# Patient Record
Sex: Male | Born: 1935 | Race: White | Hispanic: No
Health system: Midwestern US, Academic
[De-identification: ages and names within clinical notes are randomized; demographics above are authoritative.]

---

## 2017-05-31 ENCOUNTER — Encounter: Admit: 2017-05-31 | Discharge: 2017-05-31 | Payer: MEDICARE

## 2017-05-31 ENCOUNTER — Ambulatory Visit: Admit: 2017-05-31 | Discharge: 2017-06-01 | Payer: MEDICARE

## 2017-05-31 DIAGNOSIS — F329 Major depressive disorder, single episode, unspecified: ICD-10-CM

## 2017-05-31 DIAGNOSIS — I2510 Atherosclerotic heart disease of native coronary artery without angina pectoris: Principal | ICD-10-CM

## 2017-05-31 DIAGNOSIS — E785 Hyperlipidemia, unspecified: Secondary | ICD-10-CM

## 2017-05-31 DIAGNOSIS — D649 Anemia, unspecified: ICD-10-CM

## 2017-05-31 DIAGNOSIS — E119 Type 2 diabetes mellitus without complications: ICD-10-CM

## 2017-05-31 DIAGNOSIS — J61 Pneumoconiosis due to asbestos and other mineral fibers: Secondary | ICD-10-CM

## 2017-05-31 DIAGNOSIS — Z9229 Personal history of other drug therapy: ICD-10-CM

## 2017-05-31 DIAGNOSIS — K219 Gastro-esophageal reflux disease without esophagitis: ICD-10-CM

## 2017-05-31 DIAGNOSIS — G43909 Migraine, unspecified, not intractable, without status migrainosus: ICD-10-CM

## 2017-05-31 DIAGNOSIS — E1169 Type 2 diabetes mellitus with other specified complication: ICD-10-CM

## 2017-05-31 DIAGNOSIS — I10 Essential (primary) hypertension: ICD-10-CM

## 2017-05-31 DIAGNOSIS — I4891 Unspecified atrial fibrillation: ICD-10-CM

## 2017-05-31 DIAGNOSIS — N529 Male erectile dysfunction, unspecified: ICD-10-CM

## 2017-05-31 DIAGNOSIS — T50905A Adverse effect of unspecified drugs, medicaments and biological substances, initial encounter: ICD-10-CM

## 2017-05-31 MED ORDER — ATORVASTATIN 20 MG PO TAB
20 mg | ORAL_TABLET | Freq: Every day | ORAL | 3 refills | Status: AC
Start: 2017-05-31 — End: 2017-06-16

## 2017-05-31 NOTE — Progress Notes
Date of Service: 05/31/2017    Andrew Chung is a 83 y.o. male.       HPI     Andrew Chung is a very pleasant 83 year old gentleman who looks wonderful for his age.  He is coming in for a regularly scheduled visit regarding his coronary disease.    He has lost some weight.  The back pain is much better after the surgery.  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  His two-vessel bypass was in 2012 using a mammary to the LAD and a vein graft to the RCA.  A stress test with [place] was done back in January this year and showed no evidence of significant perfusion abnormality but there was a note indicating presence of TID.  I think this is an artifact as it does not go along with the patient's lack of symptoms and his excellent excellent exertional capacity.    He has not been taking his lovastatin due to muscle aches and pains.         Vitals:    05/31/17 1408   BP: 150/62   Pulse: 56   Weight: 75.3 kg (166 lb)   Height: 1.753 m (5' 9)     Body mass index is 24.51 kg/m???.     Past Medical History  Patient Active Problem List    Diagnosis Date Noted   ??? Vertigo 09/11/2013     08/24/13 CT Head w/o Contrast: [facility]:  1. Mild to moderate chronic periventricular ischemic changes.  2. No acute appearing intracranial process.       ??? PVC's (premature ventricular contractions) 05/14/2013   ??? Sinus bradycardia 04/09/2013     8/14 treadmill testing Underlying rhythm is sinus brady with isolated PVCs. The pt exercised on a Std Bruce protocol. The HR increased to the 60s & 70s quickly. It was 100-110/min by the end of Stg 1. The peak HR was 137/min (90% of MPHR) at the end of 6 min, No ST depressions were seen, The PVCs were suppressed during exercise.  8/14 HR 45/min, no symptoms. TSH normal in 8/14.     ??? Pre-syncope 11/03/2011     2/13 seen at the [facility] hosp ER, ACEI & bb d/ced as the HR was in the 40s & BP was also low ??? CVA (cerebral infarction) 07/04/2011     10/12 MRI done for headaches- showed a small CVA per pt.     ??? PAF (paroxysmal atrial fibrillation) (HCC) 11/18/2010     7/12 off amio  Developed atrial fibrillation post-CABG phase. Rx with amiodarone.     ??? 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 GERD (gastroesophageal reflux disease) 08/06/2009   ??? Urethral stricture 08/06/2009   ??? ED (erectile dysfunction) 08/06/2009   ??? HTN (hypertension) 03/11/2009   ??? CAD (coronary artery disease) 02/17/2009     5/13 Rega thallium -ve for ischemia. Done for evaluation of CPs after CABG  1.  01/07 BMS distal LAD3x12, BMS distal LCF 2-25x12, prox LAD 50% Rx medically  2.  08/09 stress thall EF 66% normal  3.  06/10 Echo EF 60%, mild LVH, chambers normal, mild aortic sclerosis, PAP 27  4.  07/10 chest pain.  Cath at [place]- LM normal , LAD prox 50% mid 60%, distal  stent 20%, LCX stants 20%,    RCA chronic 99% stenosis.  FFR negative in LAD EF 55% Rx medically  5.  3/12 CABG 2 V bypass     ??? Diabetes mellitus type 2 in obese (HCC) 02/17/2009   ??? Dyslipidemia 02/17/2009     5/13 LDL 101  5/12 excellent profile except for TG at 202     ??? Pulmonary asbestosis (HCC) 02/17/2009     F/u with pulmonary at [facility]           Review of Systems   Constitution: Positive for weight loss.   Musculoskeletal: Positive for stiffness.   Gastrointestinal: Positive for abdominal pain.   All other systems reviewed and are negative.      Physical Exam  General Appearance: well for age, appears comfortable  Skin: warm, no ulcers, no xanthomas  Eyes/lids: no conjunctival pallor, no arcus, no xanthelasma  Lips/oral mucosa: no cyanosis  Neck: neck veins flat  Carotids: normal upstroke, no bruit  Chest: normal appearance  Lungs: clear  Cardiac rhythm: regular rhythm & normal rate  Cardiac auscultation: Normal S1 & S2, no S3 or S4, no murmur Abdomen: soft, non tender, bowel sounds normal, no pulsations/bruits  Extremities: no LE edema, no varicosities, 2+ distal pulses  Neurologic/psych: oriented, no gross motor deficits, normal gait, normal mood & affect     The EKG shows mild sinus bradycardia, otherwise within normal limits  Cardiovascular Studies      Problems Addressed Today  Encounter Diagnoses   Name Primary?   ??? Dyslipidemia Yes   ??? Coronary artery disease involving native coronary artery of native heart without angina pectoris        Assessment and Plan     ???In summary, Andrew Chung is an 83 year old gentleman, looks much younger than his stated age, here with the underlying cardiovascular issues:   ??????  1. Hypertension,???slightly uncontrolled.  We will follow this closely before making any medication changes.  It has been fairly well controlled in the past.  2. Coronary artery disease, history of CABG.???He  has had no angina lately.  3. Dyslipidemia, not on statins due to muscle aches and pains.  Atorvastatin at 20 mg daily is being started.  A cholesterol profile will be needed in 3 months.  4. Sinus bradycardia, asymptomatic.  ???  It was a pleasure to see Andrew Chung in the clinic today.  ???             Current Medications (including today's revisions)  ??? amLODIPine (NORVASC) 5 mg tablet Take 1 tablet by mouth daily.   ??? aspirin 325 mg tablet Take 1 Tab by mouth daily.   ??? atorvastatin (LIPITOR) 20 mg tablet Take one tablet by mouth daily.   ??? clopiDOGrel (PLAVIX) 75 mg tablet Take 75 mg by mouth daily.     ??? nitroglycerin (NITROSTAT) 0.4 mg tablet For CHEST PAIN, sit down, place 1 tab under tongue, may repeat 2 more times if chest pain persists. If unrelieved, call 911, go to ER   ??? omeprazole DR(+) (PRILOSEC) 20 mg capsule Take 20 mg by mouth daily.   ??? sitaGLIPtin (JANUVIA) 100 mg tab tablet Take 100 mg by mouth daily.   ??? tamsulosin (FLOMAX) 0.4 mg capsule Take 0.4 mg by mouth at bedtime daily. Do not crush, chew or open capsules. Take 30 minutes following the same meal each day.

## 2017-06-01 ENCOUNTER — Encounter: Admit: 2017-06-01 | Discharge: 2017-06-01 | Payer: MEDICARE

## 2017-06-01 DIAGNOSIS — Z951 Presence of aortocoronary bypass graft: ICD-10-CM

## 2017-06-01 DIAGNOSIS — R001 Bradycardia, unspecified: ICD-10-CM

## 2017-06-01 DIAGNOSIS — I10 Essential (primary) hypertension: ICD-10-CM

## 2017-06-01 DIAGNOSIS — I2510 Atherosclerotic heart disease of native coronary artery without angina pectoris: Principal | ICD-10-CM

## 2017-06-01 DIAGNOSIS — E785 Hyperlipidemia, unspecified: Secondary | ICD-10-CM

## 2017-06-01 NOTE — Telephone Encounter
Pt's girlfriend Bonita Quin called to see what was said during office visit since pt gets confused about it. She usually comes with him but couldn't yesterday due to work. She is on his list of people to talk to. I called her back and read to her the AVS. She verbalized understanding.

## 2017-06-16 ENCOUNTER — Encounter: Admit: 2017-06-16 | Discharge: 2017-06-16 | Payer: MEDICARE

## 2017-06-16 NOTE — Telephone Encounter
Pt's friend Bonita Quin called stating pt has stopped taking atorvastatin due to "severe itching". She reports pt's symptoms (itching) increased for 3 nights. He was not SOA and did not develop hives. Bonita Quin gave pt a Benadryl on the 3rd night and symptoms resolved. Pt stopped taking atorvastatin. I will talk with Dr Iver Nestle on 10/15 to let him know of pt's intolerance to atorvastatin.

## 2017-06-19 NOTE — Telephone Encounter
Reviewed information with Dr Iver Nestle who recommends pt try atorvastatin 20 mg every other day for a week or 2. If no symptoms, increase to daily. If pt still does not tolerate atorvastatin, Dr Iver Nestle recommends pt stop atorvastatin and try pravastatin 40 mg every other day for a week or 2. If pt tolerates pravastatin, increase to 40 mg daily. Pt will return call if he is not tolerating atorvastatin. Dr Iver Nestle is wanting pt to be on a statin due to history of CABG. Pt agreeable to plan.

## 2017-08-25 ENCOUNTER — Encounter: Admit: 2017-08-25 | Discharge: 2017-08-25 | Payer: MEDICARE

## 2017-08-25 NOTE — Progress Notes
Lab order with letter mailed.

## 2017-09-07 ENCOUNTER — Encounter: Admit: 2017-09-07 | Discharge: 2017-09-07 | Payer: MEDICARE

## 2017-09-07 DIAGNOSIS — I2510 Atherosclerotic heart disease of native coronary artery without angina pectoris: Principal | ICD-10-CM

## 2017-09-07 LAB — LIPID PROFILE
Lab: 147 g/dL (ref >60)
Lab: 55 FL — ABNORMAL LOW (ref 7–11)
Lab: 71 K/UL (ref 150–400)
Lab: 82 % (ref 11–15)

## 2017-10-12 ENCOUNTER — Encounter: Admit: 2017-10-12 | Discharge: 2017-10-12 | Payer: MEDICARE

## 2017-10-12 MED ORDER — AMLODIPINE 5 MG PO TAB
5 mg | ORAL_TABLET | Freq: Every day | ORAL | 2 refills | Status: AC
Start: 2017-10-12 — End: 2017-11-09

## 2017-10-16 ENCOUNTER — Encounter: Admit: 2017-10-16 | Discharge: 2017-10-16 | Payer: MEDICARE

## 2017-10-16 LAB — COMPREHENSIVE METABOLIC PANEL
Lab: 0.8
Lab: 0.9
Lab: 10
Lab: 10 — ABNORMAL HIGH (ref 27–31)
Lab: 105
Lab: 139 — ABNORMAL LOW (ref 4.7–6.1)
Lab: 160 — ABNORMAL HIGH (ref 83–110)
Lab: 20
Lab: 24
Lab: 4
Lab: 4.2
Lab: 7.6
Lab: 84
Lab: 87
Lab: 9.6

## 2017-10-16 LAB — CBC: Lab: 4.8

## 2017-10-20 ENCOUNTER — Encounter: Admit: 2017-10-20 | Discharge: 2017-10-20 | Payer: MEDICARE

## 2017-11-03 ENCOUNTER — Encounter: Admit: 2017-11-03 | Discharge: 2017-11-03 | Payer: MEDICARE

## 2017-11-03 ENCOUNTER — Ambulatory Visit: Admit: 2017-11-03 | Discharge: 2017-11-04 | Payer: MEDICARE

## 2017-11-03 DIAGNOSIS — I10 Essential (primary) hypertension: ICD-10-CM

## 2017-11-03 DIAGNOSIS — D649 Anemia, unspecified: ICD-10-CM

## 2017-11-03 DIAGNOSIS — N529 Male erectile dysfunction, unspecified: ICD-10-CM

## 2017-11-03 DIAGNOSIS — G43909 Migraine, unspecified, not intractable, without status migrainosus: ICD-10-CM

## 2017-11-03 DIAGNOSIS — E1169 Type 2 diabetes mellitus with other specified complication: ICD-10-CM

## 2017-11-03 DIAGNOSIS — E119 Type 2 diabetes mellitus without complications: ICD-10-CM

## 2017-11-03 DIAGNOSIS — K219 Gastro-esophageal reflux disease without esophagitis: ICD-10-CM

## 2017-11-03 DIAGNOSIS — E785 Hyperlipidemia, unspecified: ICD-10-CM

## 2017-11-03 DIAGNOSIS — J61 Pneumoconiosis due to asbestos and other mineral fibers: Secondary | ICD-10-CM

## 2017-11-03 DIAGNOSIS — I2510 Atherosclerotic heart disease of native coronary artery without angina pectoris: Principal | ICD-10-CM

## 2017-11-03 DIAGNOSIS — Z9229 Personal history of other drug therapy: ICD-10-CM

## 2017-11-03 DIAGNOSIS — T50905A Adverse effect of unspecified drugs, medicaments and biological substances, initial encounter: ICD-10-CM

## 2017-11-03 DIAGNOSIS — F329 Major depressive disorder, single episode, unspecified: ICD-10-CM

## 2017-11-03 DIAGNOSIS — I4891 Unspecified atrial fibrillation: ICD-10-CM

## 2017-11-03 MED ORDER — ATORVASTATIN 20 MG PO TAB
20 mg | ORAL_TABLET | Freq: Every day | ORAL | 3 refills | Status: AC
Start: 2017-11-03 — End: ?

## 2017-11-03 MED ORDER — OLMESARTAN 5 MG PO TAB
5 mg | ORAL_TABLET | Freq: Every day | ORAL | 3 refills | 90.00000 days | Status: AC
Start: 2017-11-03 — End: 2017-11-10

## 2017-11-04 DIAGNOSIS — Z79899 Other long term (current) drug therapy: ICD-10-CM

## 2017-11-04 DIAGNOSIS — E785 Hyperlipidemia, unspecified: ICD-10-CM

## 2017-11-04 DIAGNOSIS — R001 Bradycardia, unspecified: ICD-10-CM

## 2017-11-04 DIAGNOSIS — I2510 Atherosclerotic heart disease of native coronary artery without angina pectoris: Principal | ICD-10-CM

## 2017-11-08 ENCOUNTER — Encounter: Admit: 2017-11-08 | Discharge: 2017-11-08 | Payer: MEDICARE

## 2017-11-09 ENCOUNTER — Encounter: Admit: 2017-11-09 | Discharge: 2017-11-09 | Payer: MEDICARE

## 2017-11-10 MED ORDER — AMLODIPINE 5 MG PO TAB
5 mg | ORAL_TABLET | Freq: Every day | ORAL | 1 refills | Status: AC
Start: 2017-11-10 — End: 2018-12-14

## 2017-11-20 ENCOUNTER — Encounter: Admit: 2017-11-20 | Discharge: 2017-11-20 | Payer: MEDICARE

## 2017-11-21 ENCOUNTER — Ambulatory Visit: Admit: 2017-11-21 | Discharge: 2017-11-21 | Payer: MEDICARE

## 2017-11-21 DIAGNOSIS — I2510 Atherosclerotic heart disease of native coronary artery without angina pectoris: Principal | ICD-10-CM

## 2017-11-21 MED ORDER — ALBUTEROL SULFATE 90 MCG/ACTUATION IN HFAA
2 | RESPIRATORY_TRACT | 0 refills | Status: DC | PRN
Start: 2017-11-21 — End: 2017-11-26

## 2017-11-21 MED ORDER — AMINOPHYLLINE 500 MG/20 ML IV SOLN
50 mg | INTRAVENOUS | 0 refills | Status: AC | PRN
Start: 2017-11-21 — End: ?

## 2017-11-21 MED ORDER — REGADENOSON 0.4 MG/5 ML IV SYRG
.4 mg | Freq: Once | INTRAVENOUS | 0 refills | Status: CP
Start: 2017-11-21 — End: ?
  Administered 2017-11-21: 14:00:00 0.4 mg via INTRAVENOUS

## 2017-11-21 MED ORDER — NITROGLYCERIN 0.4 MG SL SUBL
.4 mg | SUBLINGUAL | 0 refills | Status: AC | PRN
Start: 2017-11-21 — End: ?

## 2017-11-21 MED ORDER — SODIUM CHLORIDE 0.9 % IV SOLP
250 mL | INTRAVENOUS | 0 refills | Status: AC | PRN
Start: 2017-11-21 — End: ?

## 2017-12-05 ENCOUNTER — Ambulatory Visit: Admit: 2017-12-05 | Discharge: 2017-12-06 | Payer: MEDICARE

## 2017-12-05 ENCOUNTER — Encounter: Admit: 2017-12-05 | Discharge: 2017-12-05 | Payer: MEDICARE

## 2017-12-05 DIAGNOSIS — J61 Pneumoconiosis due to asbestos and other mineral fibers: ICD-10-CM

## 2017-12-05 DIAGNOSIS — K219 Gastro-esophageal reflux disease without esophagitis: ICD-10-CM

## 2017-12-05 DIAGNOSIS — I2510 Atherosclerotic heart disease of native coronary artery without angina pectoris: Principal | ICD-10-CM

## 2017-12-05 DIAGNOSIS — N529 Male erectile dysfunction, unspecified: ICD-10-CM

## 2017-12-05 DIAGNOSIS — G43909 Migraine, unspecified, not intractable, without status migrainosus: ICD-10-CM

## 2017-12-05 DIAGNOSIS — F329 Major depressive disorder, single episode, unspecified: ICD-10-CM

## 2017-12-05 DIAGNOSIS — Z9229 Personal history of other drug therapy: ICD-10-CM

## 2017-12-05 DIAGNOSIS — D649 Anemia, unspecified: ICD-10-CM

## 2017-12-05 DIAGNOSIS — T50905A Adverse effect of unspecified drugs, medicaments and biological substances, initial encounter: ICD-10-CM

## 2017-12-05 DIAGNOSIS — I4891 Unspecified atrial fibrillation: ICD-10-CM

## 2017-12-05 DIAGNOSIS — E119 Type 2 diabetes mellitus without complications: ICD-10-CM

## 2017-12-05 DIAGNOSIS — E785 Hyperlipidemia, unspecified: Secondary | ICD-10-CM

## 2017-12-05 DIAGNOSIS — I10 Essential (primary) hypertension: ICD-10-CM

## 2017-12-05 DIAGNOSIS — E1169 Type 2 diabetes mellitus with other specified complication: ICD-10-CM

## 2017-12-06 DIAGNOSIS — I2510 Atherosclerotic heart disease of native coronary artery without angina pectoris: Principal | ICD-10-CM

## 2017-12-06 DIAGNOSIS — E785 Hyperlipidemia, unspecified: ICD-10-CM

## 2017-12-06 DIAGNOSIS — I10 Essential (primary) hypertension: ICD-10-CM

## 2017-12-06 DIAGNOSIS — Z951 Presence of aortocoronary bypass graft: ICD-10-CM

## 2018-03-05 ENCOUNTER — Encounter: Admit: 2018-03-05 | Discharge: 2018-03-05 | Payer: MEDICARE

## 2018-09-11 ENCOUNTER — Encounter: Admit: 2018-09-11 | Discharge: 2018-09-11 | Payer: MEDICARE

## 2018-12-13 ENCOUNTER — Encounter: Admit: 2018-12-13 | Discharge: 2018-12-13 | Payer: MEDICARE

## 2018-12-14 MED ORDER — AMLODIPINE 5 MG PO TAB
ORAL_TABLET | Freq: Every day | 0 refills | Status: DC
Start: 2018-12-14 — End: 2019-04-03

## 2018-12-31 ENCOUNTER — Encounter: Admit: 2018-12-31 | Discharge: 2018-12-31 | Payer: MEDICARE

## 2019-01-01 ENCOUNTER — Encounter: Admit: 2019-01-01 | Discharge: 2019-01-01 | Payer: MEDICARE

## 2019-01-01 ENCOUNTER — Ambulatory Visit: Admit: 2019-01-01 | Discharge: 2019-01-02 | Payer: MEDICARE

## 2019-01-01 DIAGNOSIS — E1169 Type 2 diabetes mellitus with other specified complication: ICD-10-CM

## 2019-01-01 DIAGNOSIS — I2510 Atherosclerotic heart disease of native coronary artery without angina pectoris: Principal | ICD-10-CM

## 2019-01-01 DIAGNOSIS — E119 Type 2 diabetes mellitus without complications: ICD-10-CM

## 2019-01-01 DIAGNOSIS — F329 Major depressive disorder, single episode, unspecified: ICD-10-CM

## 2019-01-01 DIAGNOSIS — I10 Essential (primary) hypertension: ICD-10-CM

## 2019-01-01 DIAGNOSIS — N529 Male erectile dysfunction, unspecified: ICD-10-CM

## 2019-01-01 DIAGNOSIS — K219 Gastro-esophageal reflux disease without esophagitis: ICD-10-CM

## 2019-01-01 DIAGNOSIS — G43909 Migraine, unspecified, not intractable, without status migrainosus: ICD-10-CM

## 2019-01-01 DIAGNOSIS — D649 Anemia, unspecified: ICD-10-CM

## 2019-01-01 DIAGNOSIS — I4891 Unspecified atrial fibrillation: ICD-10-CM

## 2019-01-01 DIAGNOSIS — J61 Pneumoconiosis due to asbestos and other mineral fibers: Secondary | ICD-10-CM

## 2019-01-01 DIAGNOSIS — T50905A Adverse effect of unspecified drugs, medicaments and biological substances, initial encounter: ICD-10-CM

## 2019-01-01 DIAGNOSIS — Z9229 Personal history of other drug therapy: ICD-10-CM

## 2019-01-01 DIAGNOSIS — E785 Hyperlipidemia, unspecified: ICD-10-CM

## 2019-01-02 DIAGNOSIS — E785 Hyperlipidemia, unspecified: ICD-10-CM

## 2019-01-02 DIAGNOSIS — I2510 Atherosclerotic heart disease of native coronary artery without angina pectoris: Principal | ICD-10-CM

## 2019-01-08 ENCOUNTER — Encounter: Admit: 2019-01-08 | Discharge: 2019-01-08 | Payer: MEDICARE

## 2019-01-08 LAB — LIPID PROFILE
Lab: 115
Lab: 142
Lab: 2
Lab: 23
Lab: 59
Lab: 60

## 2019-01-17 ENCOUNTER — Encounter: Admit: 2019-01-17 | Discharge: 2019-01-17 | Payer: MEDICARE

## 2019-01-17 NOTE — Telephone Encounter
Pt called for FLP results. Given and explained no changes at this time.

## 2019-04-01 ENCOUNTER — Ambulatory Visit: Admit: 2019-04-01 | Discharge: 2019-04-02

## 2019-04-01 ENCOUNTER — Encounter: Admit: 2019-04-01 | Discharge: 2019-04-01

## 2019-04-01 DIAGNOSIS — I499 Cardiac arrhythmia, unspecified: Secondary | ICD-10-CM

## 2019-04-01 DIAGNOSIS — R42 Dizziness and giddiness: Secondary | ICD-10-CM

## 2019-04-01 DIAGNOSIS — Z9889 Other specified postprocedural states: Secondary | ICD-10-CM

## 2019-04-01 MED ORDER — PERFLUTREN LIPID MICROSPHERES 1.1 MG/ML IV SUSP
1-20 mL | Freq: Once | INTRAVENOUS | 0 refills | Status: CP | PRN
Start: 2019-04-01 — End: ?

## 2019-04-02 ENCOUNTER — Encounter: Admit: 2019-04-02 | Discharge: 2019-04-02

## 2019-04-02 NOTE — Progress Notes
Re Continuity of Care:Urgent Request for records for upcoming cardiology appointment on 04/09/19    Please fax the following records to [phone]:    Recent Office Visit Summary     Cardiology Imaging Reports:  Echocardiogram, Nuclear, MRI, CT    Recent EKG    Recent Labs    Thank you.

## 2019-04-02 NOTE — Telephone Encounter
Received call from SO relating patient saw PCP in last week and had echo yesterday [facility]) --advised by PCP to F/U with cardiology.  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  She reports patient tiring more easily and reporting "BP running lower than normal at times, 100-110/60's."  Scheduled for follow up with RKB on 8/4 in [facility] office as requested. Records requested from PCP

## 2019-04-03 ENCOUNTER — Encounter: Admit: 2019-04-03 | Discharge: 2019-04-03

## 2019-04-03 MED ORDER — AMLODIPINE 5 MG PO TAB
ORAL_TABLET | Freq: Every day | 0 refills | Status: DC
Start: 2019-04-03 — End: 2019-04-09

## 2019-04-04 ENCOUNTER — Encounter: Admit: 2019-04-04 | Discharge: 2019-04-04

## 2019-04-04 NOTE — Progress Notes
Andrew Chung, 1930/10/23 has an appointment with Dr. Curly Shores on 04/09/2019.    Please send recent lab results for continuity of care.    Thank you,   [name]    Phone: [phone]  Fax: [phone]

## 2019-04-09 ENCOUNTER — Encounter: Admit: 2019-04-09 | Discharge: 2019-04-09

## 2019-04-09 ENCOUNTER — Ambulatory Visit: Admit: 2019-04-09 | Discharge: 2019-04-09

## 2019-04-09 DIAGNOSIS — I4891 Unspecified atrial fibrillation: Secondary | ICD-10-CM

## 2019-04-09 DIAGNOSIS — T50905A Adverse effect of unspecified drugs, medicaments and biological substances, initial encounter: Secondary | ICD-10-CM

## 2019-04-09 DIAGNOSIS — I2510 Atherosclerotic heart disease of native coronary artery without angina pectoris: Secondary | ICD-10-CM

## 2019-04-09 DIAGNOSIS — R42 Dizziness and giddiness: Secondary | ICD-10-CM

## 2019-04-09 DIAGNOSIS — E1169 Type 2 diabetes mellitus with other specified complication: Secondary | ICD-10-CM

## 2019-04-09 DIAGNOSIS — I10 Essential (primary) hypertension: Secondary | ICD-10-CM

## 2019-04-09 DIAGNOSIS — G43909 Migraine, unspecified, not intractable, without status migrainosus: Secondary | ICD-10-CM

## 2019-04-09 DIAGNOSIS — N529 Male erectile dysfunction, unspecified: Secondary | ICD-10-CM

## 2019-04-09 DIAGNOSIS — K219 Gastro-esophageal reflux disease without esophagitis: Secondary | ICD-10-CM

## 2019-04-09 DIAGNOSIS — J61 Pneumoconiosis due to asbestos and other mineral fibers: Secondary | ICD-10-CM

## 2019-04-09 DIAGNOSIS — D649 Anemia, unspecified: Secondary | ICD-10-CM

## 2019-04-09 DIAGNOSIS — E119 Type 2 diabetes mellitus without complications: Secondary | ICD-10-CM

## 2019-04-09 DIAGNOSIS — Z9229 Personal history of other drug therapy: Secondary | ICD-10-CM

## 2019-04-09 DIAGNOSIS — E785 Hyperlipidemia, unspecified: Secondary | ICD-10-CM

## 2019-04-09 DIAGNOSIS — F329 Major depressive disorder, single episode, unspecified: Secondary | ICD-10-CM

## 2019-04-09 NOTE — Progress Notes
Holter Placement Record  Ordering Physician: Bhagat  Diagnosis: Dizziness  Brand: Biotel  Length: 48 hours  Holter Number: [id number]  Card Number: 0  Holter start: 04/09/2019 @ 1115 hours  Location where Holter was placed: [place]  Will Holter be returned by mail? Yes

## 2019-04-09 NOTE — Patient Instructions
STOP taking your amlodipine    Please wear a heart monitor for 48 hours    Dr. Curly Shores would like to see you in 3 months

## 2019-04-09 NOTE — Progress Notes
Date of Service: 04/09/2019    Andrew Abbe Sr. is a 85 y.o. male.       HPI   Andrew Chung is a very pleasant 85 year old gentleman with history of coronary disease, bypass surgery in 2012 with a mammary to the LAD and a vein graft to the RCA.    He denies any chest pains.  He has lost quite a bit of weight as he is not eating well.  The weight is down 253 pounds today.      The LV systolic function has been normal.  Stress test done in 2019 was negative for ischemia.    He tells me that he was seen at his primary clinic office and mentioned dizziness.  He was then sent on to [facility] where he was seen and evaluated in the emergency room and discharged.  He does not feel any palpitations.  There does not seem to be any orthostatic component to the dizziness.  He is not on beta-blockers due to prior history of sinus bradycardia.  He is on amlodipine at 5 mg daily.           Vitals:    04/09/19 1041   BP: 112/70   BP Source: Arm, Left Upper   Pulse: 63   Weight: 69.8 kg (153 lb 14.4 oz)   Height: 1.727 m (5' 8)   PainSc: Zero     Body mass index is 23.4 kg/m???.     Past Medical History  Patient Active Problem List    Diagnosis Date Noted   ??? Vertigo 09/11/2013     08/24/13 CT Head w/o Contrast: [facility]:  1. Mild to moderate chronic periventricular ischemic changes.  2. No acute appearing intracranial process.       ??? PVC's (premature ventricular contractions) 05/14/2013   ??? Sinus bradycardia 04/09/2013     8/14 treadmill testing Underlying rhythm is sinus brady with isolated PVCs. The pt exercised on a Std Bruce protocol. The HR increased to the 60s & 70s quickly. It was 100-110/min by the end of Stg 1. The peak HR was 137/min (90% of MPHR) at the end of 6 min, No ST depressions were seen, The PVCs were suppressed during exercise.  8/14 HR 45/min, no symptoms. TSH normal in 8/14.     ??? Pre-syncope 11/03/2011     2/13 seen at the [facility] hosp ER, ACEI & bb d/ced as the HR was in the 40s & BP was also low     ??? CVA (cerebral infarction) 07/04/2011     10/12 MRI done for headaches- showed a small CVA per pt.     ??? PAF (paroxysmal atrial fibrillation) (HCC) 11/18/2010     7/12 off amio  Developed atrial fibrillation post-CABG phase. Rx with amiodarone.     ??? 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 GERD (gastroesophageal reflux disease) 08/06/2009   ??? Urethral stricture 08/06/2009   ??? ED (erectile dysfunction) 08/06/2009   ??? HTN (hypertension) 03/11/2009   ??? CAD (coronary artery disease) 02/17/2009     5/13 Rega thallium -ve for ischemia. Done for evaluation of CPs after CABG  1.  01/07 BMS distal LAD3x12, BMS distal LCF 2-25x12, prox LAD 50% Rx medically  2.  08/09 stress thall EF 66% normal  3.  06/10 Echo EF 60%, mild LVH, chambers normal,  mild aortic sclerosis, PAP 27  4.  07/10 chest pain.  Cath at [place]- LM normal , LAD prox 50% mid 60%, distal stent 20%, LCX stants 20%,    RCA chronic 99% stenosis.  FFR negative in LAD EF 55% Rx medically  5.  3/12 CABG 2 V bypass     ??? Diabetes mellitus type 2 in obese (HCC) 02/17/2009   ??? Dyslipidemia 02/17/2009     5/13 LDL 101  5/12 excellent profile except for TG at 202     ??? Pulmonary asbestosis (HCC) 02/17/2009     F/u with pulmonary at [facility]           Review of Systems   Constitution: Negative.   HENT: Negative.    Eyes: Negative.    Cardiovascular: Negative.    Respiratory: Negative.    Endocrine: Negative.    Hematologic/Lymphatic: Negative.    Skin: Negative.    Gastrointestinal: Negative.    Genitourinary: Negative.    Neurological: Negative.    Psychiatric/Behavioral: Negative.    Allergic/Immunologic: Negative.        Physical Exam  General Appearance: well for age, appears comfortable  Skin: warm, no ulcers, no xanthomas  Eyes/lids: no conjunctival pallor, no arcus, no xanthelasma  Lips/oral mucosa: no cyanosis  Neck: neck veins flat Carotids: normal upstroke, no bruit  Chest: normal appearance  Lungs: clear  Cardiac rhythm: regular rhythm & normal rate  Cardiac auscultation: Normal S1 & S2, no S3 or S4, no murmur  Abdomen: soft, non tender, bowel sounds normal, no pulsations/bruits  Extremities: no LE edema, no varicosities, 2+ distal pulses  Neurologic/psych: oriented, no gross motor deficits, normal gait, normal mood & affect     EKG from the primary office, done on 7/22 looks fairly normal  Problems Addressed Today  Encounter Diagnoses   Name Primary?   ??? Dizziness Yes   ??? Coronary artery disease involving native coronary artery of native heart without angina pectoris        Assessment and Plan    ???In summary, Mr. Andrew Chung is an 85 year old gentleman, looks much younger than his stated age, here with the underlying cardiovascular issues:   ??????  1. Hypertension,???the readings are actually on the low side and may be the cause for dizziness.  The amlodipine is being discontinued.  Given the dizziness, I did request 48-hour Holter monitor.  He had some testing done at [facility] emergency room.  Will request those records.   2. Dyslipidemia,??? is tolerating low-dose atorvastatin.  The profile has been good  3. Sinus bradycardia, none noted, is today  ???  It was a pleasure to see Mr. Andrew Chung in the clinic today.  I plan to see him back in 3 months.           Current Medications (including today's revisions)  ??? aspirin 325 mg tablet Take 1 Tab by mouth daily.   ??? atorvastatin (LIPITOR) 20 mg tablet Take one tablet by mouth daily.   ??? clopiDOGrel (PLAVIX) 75 mg tablet Take 75 mg by mouth daily.     ??? nitroglycerin (NITROSTAT) 0.4 mg tablet For CHEST PAIN, sit down, place 1 tab under tongue, may repeat 2 more times if chest pain persists. If unrelieved, call 911, go to ER   ??? omeprazole DR(+) (PRILOSEC) 20 mg capsule Take 20 mg by mouth daily.   ??? sitaGLIPtin (JANUVIA) 100 mg tab tablet Take 100 mg by mouth daily. ??? tamsulosin (FLOMAX) 0.4 mg capsule Take 0.4 mg by  mouth at bedtime daily. Do not crush, chew or open capsules. Take 30 minutes following the same meal each day.

## 2019-04-10 NOTE — Progress Notes
Biotel enrollment complete

## 2019-04-12 ENCOUNTER — Encounter: Admit: 2019-04-12 | Discharge: 2019-04-12

## 2019-04-12 NOTE — Telephone Encounter
Pt's friend, Henrene Pastor, [phone] called,asking if RKB reviewed Echo done at [facility] at end of July, ordered by PCP.   OV notes indicate RKB did not have it at the time of OV. It is in O2 and onbase now. Asking RKB to review   Vaughan Basta requesting cb to herself (permission in chart) or to [place], [phone]    Report

## 2019-04-12 NOTE — Telephone Encounter
RKB reviewed Echo, no changes to any treatment, looks good. I called Linda back, reviewed Echo RKB not having any further rec at this time.   Vaughan Basta will let pt know results.   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 She said it's normal for him to lose some wt in the summer.   04/09/19 154lbs  12/2017 160  09/2016 171lbs.

## 2019-04-19 ENCOUNTER — Encounter: Admit: 2019-04-19 | Discharge: 2019-04-19

## 2019-04-19 NOTE — Telephone Encounter
Call from [place], message on triage stating pt had abnormal holter. Holter monitor report available in O2, JAT will review since RKB is gone this week.

## 2019-04-19 NOTE — Telephone Encounter
JAT reviewed, small burst of a-fib. He does not recommend starting any anti-coagulation and to have RKB follow up

## 2019-05-03 ENCOUNTER — Encounter: Admit: 2019-05-03 | Discharge: 2019-05-03

## 2019-05-03 NOTE — Telephone Encounter
Left VM requesting pt call back to discuss monitor results.

## 2019-05-03 NOTE — Telephone Encounter
-----   Message from Eugene Gavia, MD sent at 05/03/2019  9:56 AM CDT -----  A couple of strips do appear to be consistent with atrial fibrillation, he will need to be started on anticoagulation,NOAC if possible, thanks

## 2019-05-06 ENCOUNTER — Encounter: Admit: 2019-05-06 | Discharge: 2019-05-06

## 2019-05-06 MED ORDER — APIXABAN 5 MG PO TAB
5 mg | ORAL_TABLET | Freq: Two times a day (BID) | ORAL | 6 refills | Status: DC
Start: 2019-05-06 — End: 2020-01-10

## 2019-05-06 NOTE — Telephone Encounter
Message had been left for patient to R/C to discuss results and rec. Received call from [facility], (authorized to received protected info) asking for results and rec, stating patient not available to discuss.  Reviewed findings and rec as per RKB to start anticoagulation. Vaughan Basta has worked in PCP office and relates she is knowledgeable re: managing warfarin.  She asked that prescription be sent for the Eliquis , so that patient can evaluate cost of the DOAC.    She also reports that patient was "working hard outside, carrying heavy items in the heat" last weekend and experienced episode of marked SOB. She said , later, he noted mild chest discomfort, which he took dose of Aleve, thinking it was r/t carrying buckets of cement. Will work patient in to see RKB tomorrow in [facility] clinic.  Patient currently on ASA 325 mg and Plavix 75 mg.  RKB to review recommendations re dual antiplatelet and A/C therapy at OV

## 2019-05-06 NOTE — Telephone Encounter
Holter results with recommendations received from RKB.     ----- Message from Eugene Gavia, MD sent at 05/03/2019  9:56 AM CDT -----  A couple of strips do appear to be consistent with atrial fibrillation, he will need to be started on anticoagulation,NOAC if possible, thanks

## 2019-05-06 NOTE — Telephone Encounter
Called PCP, pt had chem panel in January (we don't have those results)  LP in May( we have that) and A1c in July 2020.     I asked her to send the chem profile. She'll fax it over.   Pt has OV w/ RKB tomorrow, will be starting anticoag, need current renal function.   Will see if RKB prefers new BMP prior to starting    Creat Clearance:Creat 0.88 in January, w/ wt of 69.8 kg, Creat Cl= 59.5

## 2019-05-07 ENCOUNTER — Ambulatory Visit: Admit: 2019-05-07 | Discharge: 2019-05-08

## 2019-05-07 ENCOUNTER — Encounter: Admit: 2019-05-07 | Discharge: 2019-05-07

## 2019-05-07 DIAGNOSIS — Z7901 Long term (current) use of anticoagulants: Secondary | ICD-10-CM

## 2019-05-07 DIAGNOSIS — J61 Pneumoconiosis due to asbestos and other mineral fibers: Secondary | ICD-10-CM

## 2019-05-07 DIAGNOSIS — I10 Essential (primary) hypertension: Secondary | ICD-10-CM

## 2019-05-07 DIAGNOSIS — E119 Type 2 diabetes mellitus without complications: Secondary | ICD-10-CM

## 2019-05-07 DIAGNOSIS — E7849 Other hyperlipidemia: Secondary | ICD-10-CM

## 2019-05-07 DIAGNOSIS — N529 Male erectile dysfunction, unspecified: Secondary | ICD-10-CM

## 2019-05-07 DIAGNOSIS — E1169 Type 2 diabetes mellitus with other specified complication: Secondary | ICD-10-CM

## 2019-05-07 DIAGNOSIS — F329 Major depressive disorder, single episode, unspecified: Secondary | ICD-10-CM

## 2019-05-07 DIAGNOSIS — K219 Gastro-esophageal reflux disease without esophagitis: Secondary | ICD-10-CM

## 2019-05-07 DIAGNOSIS — E785 Hyperlipidemia, unspecified: Secondary | ICD-10-CM

## 2019-05-07 DIAGNOSIS — T50905A Adverse effect of unspecified drugs, medicaments and biological substances, initial encounter: Secondary | ICD-10-CM

## 2019-05-07 DIAGNOSIS — R634 Abnormal weight loss: Secondary | ICD-10-CM

## 2019-05-07 DIAGNOSIS — I4891 Unspecified atrial fibrillation: Secondary | ICD-10-CM

## 2019-05-07 DIAGNOSIS — G43909 Migraine, unspecified, not intractable, without status migrainosus: Secondary | ICD-10-CM

## 2019-05-07 DIAGNOSIS — Z9229 Personal history of other drug therapy: Secondary | ICD-10-CM

## 2019-05-07 DIAGNOSIS — I480 Paroxysmal atrial fibrillation: Secondary | ICD-10-CM

## 2019-05-07 DIAGNOSIS — I2510 Atherosclerotic heart disease of native coronary artery without angina pectoris: Secondary | ICD-10-CM

## 2019-05-07 DIAGNOSIS — D649 Anemia, unspecified: Secondary | ICD-10-CM

## 2019-05-07 MED ORDER — ASPIRIN 81 MG PO TBEC
81 mg | ORAL_TABLET | Freq: Every day | ORAL | 3 refills | Status: AC
Start: 2019-05-07 — End: ?

## 2019-05-07 NOTE — Progress Notes
Date of Service: 05/07/2019    Andrew Abbe Sr. is a 85 y.o. male.       HPI   Andrew Chung is a very pleasant 85 year old gentleman, well-known to me.  I saw him relatively recently for his history of coronary disease, prior bypass and dyslipidemia.      He had mentioned dizziness when I last saw him.  It prompted a Holter monitor that showed a couple of short strips of atrial fibrillation.  The patient remained asymptomatic.    He continues to work strenuously.  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  He did not seek medical attention at that time.  The symptoms resolved spontaneously in a few minutes.  According to his significant other, he works strenuously for 10 to 12 hours on most of the days.  He just has 2 meals a day.  She thinks that he is not getting enough calories and it has resulted in weight loss.    His two-vessel coronary bypass was in 2012.  The echo done recently has shown normal LV systolic function.           Vitals:    05/07/19 1308   BP: 138/68   BP Source: Arm, Left Upper   Pulse: 49   Weight: 70.8 kg (156 lb)   Height: 1.727 m (5' 8)   PainSc: Zero     Body mass index is 23.72 kg/m???.     Past Medical History  Patient Active Problem List    Diagnosis Date Noted   ??? Vertigo 09/11/2013     08/24/13 CT Head w/o Contrast: [facility]:  1. Mild to moderate chronic periventricular ischemic changes.  2. No acute appearing intracranial process.       ??? PVC's (premature ventricular contractions) 05/14/2013   ??? Sinus bradycardia 04/09/2013     8/14 treadmill testing Underlying rhythm is sinus brady with isolated PVCs. The pt exercised on a Std Bruce protocol. The HR increased to the 60s & 70s quickly. It was 100-110/min by the end of Stg 1. The peak HR was 137/min (90% of MPHR) at the end of 6 min, No ST depressions were seen, The PVCs were suppressed during exercise. 8/14 HR 45/min, no symptoms. TSH normal in 8/14.     ??? Pre-syncope 11/03/2011     2/13 seen at the [facility] hosp ER, ACEI & bb d/ced as the HR was in the 40s & BP was also low     ??? CVA (cerebral infarction) 07/04/2011     10/12 MRI done for headaches- showed a small CVA per pt.     ??? PAF (paroxysmal atrial fibrillation) (HCC) 11/18/2010     7/12 off amio  Developed atrial fibrillation post-CABG phase. Rx with amiodarone.     ??? 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 GERD (gastroesophageal reflux disease) 08/06/2009   ??? Urethral stricture 08/06/2009   ??? ED (erectile dysfunction) 08/06/2009   ??? HTN (hypertension) 03/11/2009   ??? CAD (coronary artery disease) 02/17/2009     5/13 Rega thallium -ve for ischemia. Done for evaluation of CPs after CABG  1.  01/07 BMS distal [id number], BMS distal LCF 2-25x12, prox LAD 50% Rx medically  2.  08/09 stress  thall EF 66% normal  3.  06/10 Echo EF 60%, mild LVH, chambers normal, mild aortic sclerosis, PAP 27  4.  07/10 chest pain.  Cath at [facility]- LM normal , LAD prox 50% mid 60%, distal stent 20%, LCX stants 20%,    RCA chronic 99% stenosis.  FFR negative in LAD EF 55% Rx medically  5.  3/12 CABG 2 V bypass     ??? Diabetes mellitus type 2 in obese (HCC) 02/17/2009   ??? Dyslipidemia 02/17/2009     5/13 LDL 101  5/12 excellent profile except for TG at 202     ??? Pulmonary asbestosis (HCC) 02/17/2009     F/u with pulmonary at [facility]           Review of Systems   Constitution: Negative.   HENT: Negative.    Eyes: Negative.    Cardiovascular: Positive for chest pain and dyspnea on exertion.   Respiratory: Positive for shortness of breath.    Endocrine: Negative.    Hematologic/Lymphatic: Negative.    Skin: Negative.    Gastrointestinal: Negative.    Genitourinary: Negative.    Neurological: Positive for dizziness.   Psychiatric/Behavioral: Negative.    Allergic/Immunologic: Negative.        Physical Exam General Appearance: well for age, appears comfortable  Skin: warm, no ulcers, no xanthomas  Eyes/lids: no conjunctival pallor, no arcus, no xanthelasma  Lips/oral mucosa: no cyanosis  Neck: neck veins flat  Carotids: normal upstroke, no bruit  Chest: normal appearance  Lungs: clear  Cardiac rhythm: regular rhythm & normal rate  Cardiac auscultation: Normal S1 & S2, no S3 or S4, no murmur  Abdomen: soft, non tender, bowel sounds normal, no pulsations/bruits  Extremities: no LE edema, no varicosities, 2+ distal pulses  Neurologic/psych: oriented, no gross motor deficits, normal gait, normal mood & affect       EKG done today shows sinus rhythm, PVCs, no acute ischemic changes   Problems Addressed Today  Encounter Diagnoses   Name Primary?   ??? Coronary artery disease involving native coronary artery of native heart without angina pectoris Yes   ??? PAF (paroxysmal atrial fibrillation) (HCC)        Assessment and Plan    ???In summary, Mr. Andrew Chung is an 85 year old gentleman, looks much younger than his stated age, here with the underlying cardiovascular issues:   ??????  1. Hypertension,???with significant weight loss and now controlled without any medications.   2. Dyslipidemia,??????is tolerating low-dose atorvastatin, well controlled.  3. Coronary disease, prior bypass surgery, with significant chest pains over the weekend.  There are some atypical features to the chest pain.  The EKG is unremarkable.  A pharmacological stress test has been recommended.  Despite his advanced age, I think he continues to be involved in very strenuous activity.  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  4. Paroxysmal atrial fibrillation, asymptomatic, incidentally found during recent Holter monitor.  Clopidogrel can be discontinued.  He is being started on Eliquis for thromboembolism prophylaxis.  I have advised him to cut back on the aspirin to 81 mg daily.      ??? It was a pleasure to see Mr. Andrew Chung in the clinic today.  I plan to see him back in 6 months.           Current Medications (including today's revisions)  ??? apixaban (ELIQUIS) 5 mg tablet Take one tablet by mouth twice daily.   ???  aspirin EC 81 mg tablet Take one tablet by mouth daily. Take with food.   ??? atorvastatin (LIPITOR) 20 mg tablet Take one tablet by mouth daily.   ??? nitroglycerin (NITROSTAT) 0.4 mg tablet For CHEST PAIN, sit down, place 1 tab under tongue, may repeat 2 more times if chest pain persists. If unrelieved, call 911, go to ER   ??? sitaGLIPtin (JANUVIA) 100 mg tab tablet Take 100 mg by mouth daily.   ??? tamsulosin (FLOMAX) 0.4 mg capsule Take 0.4 mg by mouth at bedtime daily. Do not crush, chew or open capsules. Take 30 minutes following the same meal each day.

## 2019-05-07 NOTE — Patient Instructions
MAC Nuclear Stress Test Instructions    PLEASE REPORT TO:    ____[facility] [id number], [place], [place]) - [phone]   ____[facility] ([address], [address], [place], [place]) - [phone]    ____[facility] ([address] [place], [place]) - [phone]    ____[facility] ([address], [address], [place], [place]) - [phone]   ____[facility] ([address], [place], [place] ) - [phone]     MAC Main Phone Number: [phone]     Date of Test  ____________  at ____________  for ________________________         The Thallium evaluation has two parts -- two nuclear scans.   The first scan is done in the morning and the second three to four hours later.    Wear comfortable clothing. Bring or wear comfortable walking shoes.   It is recommended not to hold infants for 2 to 3 days after the test.   Please let the nuclear technologists know if you plan on flying after the test.    NO CAFFEINE 24 HOURS PRIOR TO TEST. Examples: coffee, tea, decaf coffee or tea, cola, chocolate.     DO NOT EAT OR DRINK THE MORNING OF YOUR TEST unless otherwise instructed. (You may have a couple sips of water.) If you are a diabetic, if insulin dependent: please take one third of your insulin with a light breakfast (two pieces of dry toast and a small juice). Bring insulin and medication with you to the test.     ___ TAKE MORNING MEDICATIONS WITH A COUPLE GLASSES OF WATER PRIOR TO TEST.     HOLD THE FOLLOWING MEDICATIONS AS INDICATED BELOW:      ??? Over the counter vitamins, minerals and supplements      WHAT TO DO BETWEEN THE FIRST TWO THALLIUM SCANS:  1. No strenuous exercise should be performed during this time.  2. A light lunch is permissible. The technologist will give you a list of appropriate foods.  3. Please return 15 minutes prior to the schedule of your second scan. Our nuclear technologist will tell you exactly what time to return. 4. Please do not use tobacco products in between scans.  5. After the first scan is completed, you may resume usual medications.     TEST FINDINGS:  You will receive the results of the test within 7 business days of its completion by telephone, unless arranged differently at the time of the procedure.  If you have any questions concerning your thallium test or if you do not hear from your [facility] physician/or nurse within 7 business days, please call the appropriate office checked above.      Instructions given by Marland Kitchen, RN

## 2019-05-08 DIAGNOSIS — Z951 Presence of aortocoronary bypass graft: Secondary | ICD-10-CM

## 2019-05-08 DIAGNOSIS — R0789 Other chest pain: Secondary | ICD-10-CM

## 2019-05-14 ENCOUNTER — Encounter: Admit: 2019-05-14 | Discharge: 2019-05-14

## 2019-05-14 NOTE — Telephone Encounter
Pt was instructed for NUC, no caffeine-including chocolate 24 hours prior to test, don't eat/drink after midnight except for water, hold JANUVIA am of test and it is a two part test. Pt verbalized understanding.

## 2019-05-15 ENCOUNTER — Ambulatory Visit: Admit: 2019-05-15 | Discharge: 2019-05-15

## 2019-05-15 ENCOUNTER — Encounter: Admit: 2019-05-15 | Discharge: 2019-05-15

## 2019-05-15 DIAGNOSIS — I480 Paroxysmal atrial fibrillation: Secondary | ICD-10-CM

## 2019-05-15 DIAGNOSIS — I2510 Atherosclerotic heart disease of native coronary artery without angina pectoris: Secondary | ICD-10-CM

## 2019-05-15 MED ORDER — EUCALYPTUS-MENTHOL MM LOZG
1 | Freq: Once | ORAL | 0 refills | Status: AC | PRN
Start: 2019-05-15 — End: ?

## 2019-05-15 MED ORDER — SODIUM CHLORIDE 0.9 % IV SOLP
250 mL | INTRAVENOUS | 0 refills | Status: AC | PRN
Start: 2019-05-15 — End: ?

## 2019-05-15 MED ORDER — REGADENOSON 0.4 MG/5 ML IV SYRG
.4 mg | Freq: Once | INTRAVENOUS | 0 refills | Status: DC
Start: 2019-05-15 — End: 2019-05-20

## 2019-05-15 MED ORDER — ALBUTEROL SULFATE 90 MCG/ACTUATION IN HFAA
2 | RESPIRATORY_TRACT | 0 refills | Status: DC | PRN
Start: 2019-05-15 — End: 2019-05-20

## 2019-05-15 MED ORDER — AMINOPHYLLINE 500 MG/20 ML IV SOLN
50 mg | INTRAVENOUS | 0 refills | Status: AC | PRN
Start: 2019-05-15 — End: ?

## 2019-05-15 MED ORDER — NITROGLYCERIN 0.4 MG SL SUBL
.4 mg | SUBLINGUAL | 0 refills | Status: DC | PRN
Start: 2019-05-15 — End: 2019-05-20

## 2019-05-16 ENCOUNTER — Encounter: Admit: 2019-05-16 | Discharge: 2019-05-16

## 2019-05-16 NOTE — Telephone Encounter
Spouse called stating Eliquis is over $400/month and not affordable. I called in a rx of Xalreto to see how much that would be, which was $412/month.Pharmacy thought pt might be in doughnut hole. Spouse will call insurance company.  I emailed rx assistance through [place] to reach out to pt to send a packet to see what they would qualify for. Spouse will call us back with information.

## 2019-05-16 NOTE — Telephone Encounter
Vaughan Basta, pt's friend, [phone] called back, said Silver script said that after deductible, the cost for Eliquis would be $47 which is doable.   The Rx is at [facility] [place].     I called Vaughan Basta back, left VM for her to let us know if this is working out, I thought she said he had NOT met his deductible, which would still make the Eliquis ~$400/mo.     I left our cb#

## 2019-05-27 ENCOUNTER — Encounter: Admit: 2019-05-27 | Discharge: 2019-05-27 | Payer: MEDICARE

## 2019-05-27 NOTE — Telephone Encounter
-----   Message from Eugene Gavia, MD sent at 05/27/2019  9:18 AM CDT -----  Please let the patient know that the stress looks good, thank you  ----- Message -----  From: Cleda Mccreedy, MD  Sent: 05/15/2019   5:25 PM CDT  To: Eugene Gavia, MD

## 2019-07-11 ENCOUNTER — Encounter: Admit: 2019-07-11 | Discharge: 2019-07-11 | Payer: MEDICARE

## 2019-07-11 NOTE — Progress Notes
Versie Starks Sr, 1931-03-06 has an appointment with Dr. Curly Shores on 07/23/2019.    Please send recent lab results for continuity of care.    Thank you,   Dayja Loveridge    Phone: [phone]  Fax: [phone]

## 2019-07-15 ENCOUNTER — Encounter: Admit: 2019-07-15 | Discharge: 2019-07-15 | Payer: MEDICARE

## 2019-08-07 ENCOUNTER — Encounter: Admit: 2019-08-07 | Discharge: 2019-08-07 | Payer: MEDICARE

## 2019-08-07 NOTE — Telephone Encounter
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 Pt had normal stress test in September and has a follow up on 12/31 with [facility].    Spoke with Vaughan Basta, she denies pt having any symptoms at rest. She will have pt refrain from strenuous activity until we have recommendations from [facility].

## 2019-09-03 ENCOUNTER — Encounter: Admit: 2019-09-03 | Discharge: 2019-09-03 | Payer: MEDICARE

## 2019-09-03 NOTE — Progress Notes
Andrew Starks Sr. 1931/07/19 has an appointment with Dr. Curly Shores on 09/13/2019.    Please send recent lab results for continuity of care.    Thank you,   Teliah Buffalo    Phone: [phone]  Fax: [phone]

## 2019-09-13 ENCOUNTER — Encounter: Admit: 2019-09-13 | Discharge: 2019-09-13 | Payer: MEDICARE

## 2019-09-13 ENCOUNTER — Ambulatory Visit: Admit: 2019-09-13 | Discharge: 2019-09-14 | Payer: MEDICARE

## 2019-09-13 DIAGNOSIS — K219 Gastro-esophageal reflux disease without esophagitis: Secondary | ICD-10-CM

## 2019-09-13 DIAGNOSIS — I2510 Atherosclerotic heart disease of native coronary artery without angina pectoris: Secondary | ICD-10-CM

## 2019-09-13 DIAGNOSIS — Z9229 Personal history of other drug therapy: Secondary | ICD-10-CM

## 2019-09-13 DIAGNOSIS — F329 Major depressive disorder, single episode, unspecified: Secondary | ICD-10-CM

## 2019-09-13 DIAGNOSIS — N529 Male erectile dysfunction, unspecified: Secondary | ICD-10-CM

## 2019-09-13 DIAGNOSIS — J61 Pneumoconiosis due to asbestos and other mineral fibers: Secondary | ICD-10-CM

## 2019-09-13 DIAGNOSIS — R0789 Other chest pain: Secondary | ICD-10-CM

## 2019-09-13 DIAGNOSIS — G43909 Migraine, unspecified, not intractable, without status migrainosus: Secondary | ICD-10-CM

## 2019-09-13 DIAGNOSIS — E785 Hyperlipidemia, unspecified: Secondary | ICD-10-CM

## 2019-09-13 DIAGNOSIS — E1169 Type 2 diabetes mellitus with other specified complication: Secondary | ICD-10-CM

## 2019-09-13 DIAGNOSIS — E119 Type 2 diabetes mellitus without complications: Secondary | ICD-10-CM

## 2019-09-13 DIAGNOSIS — D649 Anemia, unspecified: Secondary | ICD-10-CM

## 2019-09-13 DIAGNOSIS — I10 Essential (primary) hypertension: Secondary | ICD-10-CM

## 2019-09-13 DIAGNOSIS — I4891 Unspecified atrial fibrillation: Secondary | ICD-10-CM

## 2019-09-13 DIAGNOSIS — T50905A Adverse effect of unspecified drugs, medicaments and biological substances, initial encounter: Secondary | ICD-10-CM

## 2019-09-13 NOTE — Progress Notes
Date of Service: 09/13/2019    [name] is a 86 y.o. male.       HPI   Andrew Chung is a very pleasant 86 year old very active gentleman coming in for a follow-up regarding his coronary disease, paroxysmal atrial fibrillation and dyslipidemia.    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  He has no help with the work.  He works 10 to 12 hours a day.  He mentions chest discomfort that is felt only when he is carrying heavy logs in his arms.  Walking itself does not trigger any discomfort.  The chest pain could last for hours at a time.  He is unable to tell me if the chest pain is coming from inside the chest or its of superficial origin.  He had a stress test done in September last year that showed no evidence of myocardial ischemia and the LV systolic function has been normal with no significant valvular problems.    He has not had any palpitations lately.  He denies any dizziness or loss of consciousness.  He is not on beta-blockers on account of prior history of bradycardia and dizziness.    There is history of coronary disease.  He has had prior PCI's followed by a using a mammary artery to the LAD and a vein graft to the distal RCA.      He takes his medications regularly.           Vitals:    09/13/19 1436   BP: (!) 146/73   BP Source: Arm, Left Upper   Patient Position: Sitting   Pulse: 79   SpO2: 99%   Weight: 73 kg (161 lb)   Height: 1.727 m (5' 8)   PainSc: Zero     Body mass index is 24.48 kg/m?Marland Kitchen     Past Medical History  Patient Active Problem List    Diagnosis Date Noted   ? Vertigo 09/11/2013     08/24/13 CT Head w/o Contrast: [facility]:  1. Mild to moderate chronic periventricular ischemic changes.  2. No acute appearing intracranial process.       ? PVC's (premature ventricular contractions) 05/14/2013   ? Sinus bradycardia 04/09/2013 8/14 treadmill testing Underlying rhythm is sinus brady with isolated PVCs. The pt exercised on a Std Bruce protocol. The HR increased to the 60s & 70s quickly. It was 100-110/min by the end of Stg 1. The peak HR was 137/min (90% of MPHR) at the end of 6 min, No ST depressions were seen, The PVCs were suppressed during exercise.  8/14 HR 45/min, no symptoms. TSH normal in 8/14.     ? Pre-syncope 11/03/2011     2/13 seen at the [facility] hosp ER, ACEI & bb d/ced as the HR was in the 40s & BP was also low     ? CVA (cerebral infarction) 07/04/2011     10/12 MRI done for headaches- showed a small CVA per pt.     ? PAF (paroxysmal atrial fibrillation) (HCC) 11/18/2010     7/12 off amio  Developed atrial fibrillation post-CABG phase. Rx with amiodarone.     ? 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 GERD (gastroesophageal reflux disease) 08/06/2009   ? Urethral stricture 08/06/2009   ? ED (erectile  dysfunction) 08/06/2009   ? HTN (hypertension) 03/11/2009   ? CAD (coronary artery disease) 02/17/2009     5/13 Rega thallium -ve for ischemia. Done for evaluation of CPs after CABG  1.  01/07 BMS distal LAD3x12, BMS distal LCF 2-25x12, prox LAD 50% Rx medically  2.  08/09 stress thall EF 66% normal  3.  06/10 Echo EF 60%, mild LVH, chambers normal, mild aortic sclerosis, PAP 27  4.  07/10 chest pain.  Cath at [facility]- LM normal , LAD prox 50% mid 60%, distal stent 20%, LCX stants 20%,    RCA chronic 99% stenosis.  FFR negative in LAD EF 55% Rx medically  5.  3/12 CABG 2 V bypass     ? Diabetes mellitus type 2 in obese (HCC) 02/17/2009   ? Dyslipidemia 02/17/2009     5/13 LDL 101  5/12 excellent profile except for TG at 202     ? Pulmonary asbestosis (HCC) 02/17/2009     F/u with pulmonary at [facility]           Review of Systems   Constitution: Negative.   HENT: Negative.    Eyes: Negative.    Cardiovascular: Positive for chest pain. Respiratory: Negative.    Endocrine: Negative.    Hematologic/Lymphatic: Negative.    Skin: Negative.    Musculoskeletal: Negative.    Gastrointestinal: Positive for heartburn.   Genitourinary: Negative.    Neurological: Negative.    Psychiatric/Behavioral: Negative.    Allergic/Immunologic: Negative.        Physical Exam  General Appearance: Very well for age, appears comfortable  Skin: warm, no ulcers, no xanthomas  Eyes/lids: no conjunctival pallor, no arcus, no xanthelasma  Lips/oral mucosa: no cyanosis  Neck: neck veins flat  Carotids: normal upstroke, no bruit  Chest: normal appearance  Lungs: clear  Cardiac rhythm: regular rhythm & normal rate  Cardiac auscultation: Normal S1 & S2, no S3 or S4, no murmur  Abdomen: soft, non tender, bowel sounds normal, no pulsations/bruits  Extremities: no LE edema, no varicosities, 2+ distal pulses  Neurologic/psych: oriented, no gross motor deficits, normal gait, normal mood & affect       EKG done today shows sinus rhythm, PVCs,, no acute ischemic changes  Problems Addressed Today  Encounter Diagnoses   Name Primary?   ? Coronary artery disease involving native coronary artery of native heart without angina pectoris Yes   ? Essential hypertension    ? Dyslipidemia    ? Atypical chest pain        Assessment and Plan    ?In summary, [name] is an 85 year old gentleman, looks much younger than his stated age, here with the underlying cardiovascular issues:   ??  1. Hypertension,?with currently elevated readings but is usually well controlled.  He has also had dizziness and sinus bradycardia in the past.     2. Dyslipidemia,?well controlled on low-dose atorvastatin 3. Coronary disease, prior bypass surgery, with recurrence of chest pain.  It seems to be of musculoskeletal origin.  He works extremely strenuously at the farm.  I have urged him to get some help for the strenuous activity.  He has had a stress test done in September which showed no evidence of myocardial ischemia.  The LV systolic function has been normal.  4. Paroxysmal atrial fibrillation, asymptomatic, incidentally found on a Holter monitor.  He is on Eliquis at 5 mg twice daily.      ?  It was a pleasure to see Mr.  Burge in the clinic today.??I plan to see him back in 6 months.           Current Medications (including today's revisions)  ? apixaban (ELIQUIS) 5 mg tablet Take one tablet by mouth twice daily.   ? aspirin EC 81 mg tablet Take one tablet by mouth daily. Take with food.   ? atorvastatin (LIPITOR) 20 mg tablet Take one tablet by mouth daily.   ? nitroglycerin (NITROSTAT) 0.4 mg tablet For CHEST PAIN, sit down, place 1 tab under tongue, may repeat 2 more times if chest pain persists. If unrelieved, call 911, go to ER   ? sitaGLIPtin (JANUVIA) 100 mg tab tablet Take 100 mg by mouth daily.   ? tamsulosin (FLOMAX) 0.4 mg capsule Take 0.4 mg by mouth at bedtime daily. Do not crush, chew or open capsules. Take 30 minutes following the same meal each day.

## 2019-11-20 ENCOUNTER — Encounter: Admit: 2019-11-20 | Discharge: 2019-11-20 | Payer: MEDICARE

## 2019-11-20 NOTE — Telephone Encounter
11/20/2019 11:19 AM   Wife called questioning if pt. Was still on Amlodipine. Per  Visit with Dr. Iver Nestle on 04/09/2019 it was stopped d/t dizziness. Pt. Denies having any issues with BP at this time. Has no other questions.

## 2020-01-10 ENCOUNTER — Encounter: Admit: 2020-01-10 | Discharge: 2020-01-10 | Payer: MEDICARE

## 2020-01-10 DIAGNOSIS — I10 Essential (primary) hypertension: Secondary | ICD-10-CM

## 2020-01-10 DIAGNOSIS — E785 Hyperlipidemia, unspecified: Secondary | ICD-10-CM

## 2020-01-10 MED ORDER — ELIQUIS 5 MG PO TAB
ORAL_TABLET | Freq: Two times a day (BID) | 0 refills | Status: AC
Start: 2020-01-10 — End: ?

## 2020-01-21 ENCOUNTER — Encounter: Admit: 2020-01-21 | Discharge: 2020-01-21 | Payer: MEDICARE

## 2020-01-21 NOTE — Telephone Encounter
Pt called and left a message for [place] to call. I called pt back and he was very frustrated first about the phone system. I gave him the voicemail number in hopes that will help him next time. He then was very upset that the letter we sent him to ask him to have his labs drawn says he needs to check with his insurance company to see if it is covered. He was very angry about that letter and said he has had labs with Dr. Iver Nestle for years and never has to call the insurance company as it is always covered. I told him that is a generic letter and if nothing has changed with his insurance then he can just go have them drawn like he normally does. He did calm down.

## 2020-03-02 ENCOUNTER — Encounter: Admit: 2020-03-02 | Discharge: 2020-03-02 | Payer: MEDICARE

## 2020-03-02 NOTE — Progress Notes
[name] [birth year] has an appointment with Dr. Iver Nestle on 03/06/20.    Please send recent lab results for continuity of care.    Thank you,   Darlisha Kelm    Phone: [phone]  Fax: [phone]

## 2020-03-03 ENCOUNTER — Encounter: Admit: 2020-03-03 | Discharge: 2020-03-03 | Payer: MEDICARE

## 2020-03-06 ENCOUNTER — Encounter: Admit: 2020-03-06 | Discharge: 2020-03-06 | Payer: MEDICARE

## 2020-03-06 ENCOUNTER — Ambulatory Visit: Admit: 2020-03-06 | Discharge: 2020-03-07 | Payer: MEDICARE

## 2020-03-06 DIAGNOSIS — E1169 Type 2 diabetes mellitus with other specified complication: Secondary | ICD-10-CM

## 2020-03-06 DIAGNOSIS — E785 Hyperlipidemia, unspecified: Secondary | ICD-10-CM

## 2020-03-06 DIAGNOSIS — N529 Male erectile dysfunction, unspecified: Secondary | ICD-10-CM

## 2020-03-06 DIAGNOSIS — J61 Pneumoconiosis due to asbestos and other mineral fibers: Secondary | ICD-10-CM

## 2020-03-06 DIAGNOSIS — F329 Major depressive disorder, single episode, unspecified: Secondary | ICD-10-CM

## 2020-03-06 DIAGNOSIS — G43909 Migraine, unspecified, not intractable, without status migrainosus: Secondary | ICD-10-CM

## 2020-03-06 DIAGNOSIS — D649 Anemia, unspecified: Secondary | ICD-10-CM

## 2020-03-06 DIAGNOSIS — Z9229 Personal history of other drug therapy: Secondary | ICD-10-CM

## 2020-03-06 DIAGNOSIS — I10 Essential (primary) hypertension: Secondary | ICD-10-CM

## 2020-03-06 DIAGNOSIS — K219 Gastro-esophageal reflux disease without esophagitis: Secondary | ICD-10-CM

## 2020-03-06 DIAGNOSIS — E119 Type 2 diabetes mellitus without complications: Secondary | ICD-10-CM

## 2020-03-06 DIAGNOSIS — T50905A Adverse effect of unspecified drugs, medicaments and biological substances, initial encounter: Secondary | ICD-10-CM

## 2020-03-06 DIAGNOSIS — I4891 Unspecified atrial fibrillation: Secondary | ICD-10-CM

## 2020-03-06 DIAGNOSIS — I2510 Atherosclerotic heart disease of native coronary artery without angina pectoris: Secondary | ICD-10-CM

## 2020-03-06 NOTE — Progress Notes
Date of Service: 03/06/2020    [name] is a 86 y.o. male.       HPI    Roe Coombs is a very pleasant 86 year old gentleman coming in for continued care of his coronary disease, paroxysmal atrial fibrillation and dyslipidemia.  ?  Dee has had a bypass many years ago using a mammary to the LAD and a vein graft to the RCA.  Before the left, he also had percutaneous interventions.    He continues to be very active.  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  He puts in 1213-hour days 7 days a week. He has no help. He denies any exertional chest discomfort.  Stress test in September last year was negative for ischemia.  The LV systolic function has been normal.      He is compliant with his medications.  He denies any palpitations.  He takes Eliquis for thromboembolism prophylaxis.      He does not smoke.           Vitals:    03/06/20 1353 03/06/20 1420   BP: (!) 142/76 (!) 160/80   BP Source: Arm, Left Upper Arm, Left Upper   Patient Position: Sitting Sitting   Pulse: 76    SpO2: 97%    Weight: 71.2 kg (156 lb 14.4 oz)    Height: 1.727 m (5' 8)    PainSc: Zero      Body mass index is 23.86 kg/m?Marland Kitchen     Past Medical History  Patient Active Problem List    Diagnosis Date Noted   ? Vertigo 09/11/2013     08/24/13 CT Head w/o Contrast: [facility]:  1. Mild to moderate chronic periventricular ischemic changes.  2. No acute appearing intracranial process.       ? PVC's (premature ventricular contractions) 05/14/2013   ? Sinus bradycardia 04/09/2013     8/14 treadmill testing Underlying rhythm is sinus brady with isolated PVCs. The pt exercised on a Std Bruce protocol. The HR increased to the 60s & 70s quickly. It was 100-110/min by the end of Stg 1. The peak HR was 137/min (90% of MPHR) at the end of 6 min, No ST depressions were seen, The PVCs were suppressed during exercise.  8/14 HR 45/min, no symptoms. TSH normal in 8/14.     ? Pre-syncope 11/03/2011 2/13 seen at the [facility] hosp ER, ACEI & bb d/ced as the HR was in the 40s & BP was also low     ? CVA (cerebral infarction) 07/04/2011     10/12 MRI done for headaches- showed a small CVA per pt.     ? PAF (paroxysmal atrial fibrillation) (HCC) 11/18/2010     7/12 off amio  Developed atrial fibrillation post-CABG phase. Rx with amiodarone.     ? 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 GERD (gastroesophageal reflux disease) 08/06/2009   ? Urethral stricture 08/06/2009   ? ED (erectile dysfunction) 08/06/2009   ? HTN (hypertension) 03/11/2009   ? CAD (coronary artery disease) 02/17/2009     5/13 Rega thallium -ve for ischemia. Done for evaluation of CPs after CABG  1.  01/07 BMS distal [id number], BMS distal LCF 2-25x12, prox LAD 50% Rx medically  2.  08/09 stress thall EF  66% normal  3.  06/10 Echo EF 60%, mild LVH, chambers normal, mild aortic sclerosis, PAP 27  4.  07/10 chest pain.  Cath at [place]- LM normal , LAD prox 50% mid 60%, distal stent 20%, LCX stants 20%,    RCA chronic 99% stenosis.  FFR negative in LAD EF 55% Rx medically  5.  3/12 CABG 2 V bypass     ? Diabetes mellitus type 2 in obese (HCC) 02/17/2009   ? Dyslipidemia 02/17/2009     5/13 LDL 101  5/12 excellent profile except for TG at 202     ? Pulmonary asbestosis (HCC) 02/17/2009     F/u with pulmonary at [facility]           Review of Systems   Constitution: Negative.   HENT: Negative.    Eyes: Negative.    Cardiovascular: Positive for chest pain.   Respiratory: Negative.    Endocrine: Negative.    Hematologic/Lymphatic: Negative.    Skin: Negative.    Gastrointestinal: Negative.    Genitourinary: Negative.    Neurological: Negative.    Psychiatric/Behavioral: Negative.    Allergic/Immunologic: Negative.        Physical Exam  General Appearance: well for age, appears comfortable  Skin: warm, no ulcers, no xanthomas  Eyes/lids: no conjunctival pallor, no arcus, no xanthelasma  Lips/oral mucosa: no cyanosis  Neck: neck veins flat  Carotids: normal upstroke, no bruit  Chest: normal appearance  Lungs: clear  Cardiac rhythm: regular rhythm & normal rate  Cardiac auscultation: Normal S1 & S2, no S3 or S4, no murmur  Abdomen: soft, non tender, bowel sounds normal, no pulsations/bruits  Extremities: no LE edema, no varicosities, 2+ distal pulses  Neurologic/psych: oriented, no gross motor deficits, normal gait, normal mood & affect         Problems Addressed Today  Encounter Diagnoses   Name Primary?   ? Essential hypertension Yes   ? Coronary artery disease involving native coronary artery of native heart without angina pectoris    ? Dyslipidemia        Assessment and Plan     ?In summary, [name] is an 86 year old gentleman, looks much younger than his stated age, here with the underlying cardiovascular issues:   ??  1. Hypertension,?is usually well controlled.  I have not made any changes in his medications as he has had dizziness, sinus bradycardia and falls in the past.  ?  2. Dyslipidemia,?well controlled on low-dose atorvastatin.  Last LDL was 49 when checked in May of this year.  3. Coronary disease, prior bypass surgery/pcis, with no recurrence of chest pain. He has had a stress test done in September last year which showed no evidence of myocardial ischemia.  The LV systolic function has been normal.  4. Paroxysmal atrial fibrillation,?asymptomatic, incidentally found on a Holter monitor.  He is on Eliquis at 5 mg twice daily.  Unable to use beta-blockers on account of prior history of sinus bradycardia  ??    It was a pleasure to see Jonny Ruiz in the clinic today.         Current Medications (including today's revisions)  ? aspirin EC 81 mg tablet Take one tablet by mouth daily. Take with food.   ? atorvastatin (LIPITOR) 20 mg tablet Take one tablet by mouth daily.   ? ELIQUIS 5 mg tablet TAKE 1 TABLET BY MOUTH TWICE A DAY   ? nitroglycerin (NITROSTAT) 0.4 mg tablet For CHEST PAIN, sit down, place 1  tab under tongue, may repeat 2 more times if chest pain persists. If unrelieved, call 911, go to ER   ? sitaGLIPtin (JANUVIA) 100 mg tab tablet Take 100 mg by mouth daily.   ? tamsulosin (FLOMAX) 0.4 mg capsule Take 0.4 mg by mouth at bedtime daily. Do not crush, chew or open capsules. Take 30 minutes following the same meal each day.

## 2020-08-27 IMAGING — CR ABDOMEN
2 series · 2 of 2 positions shown · non-contrast
Comparison: none

[abdomen upright]
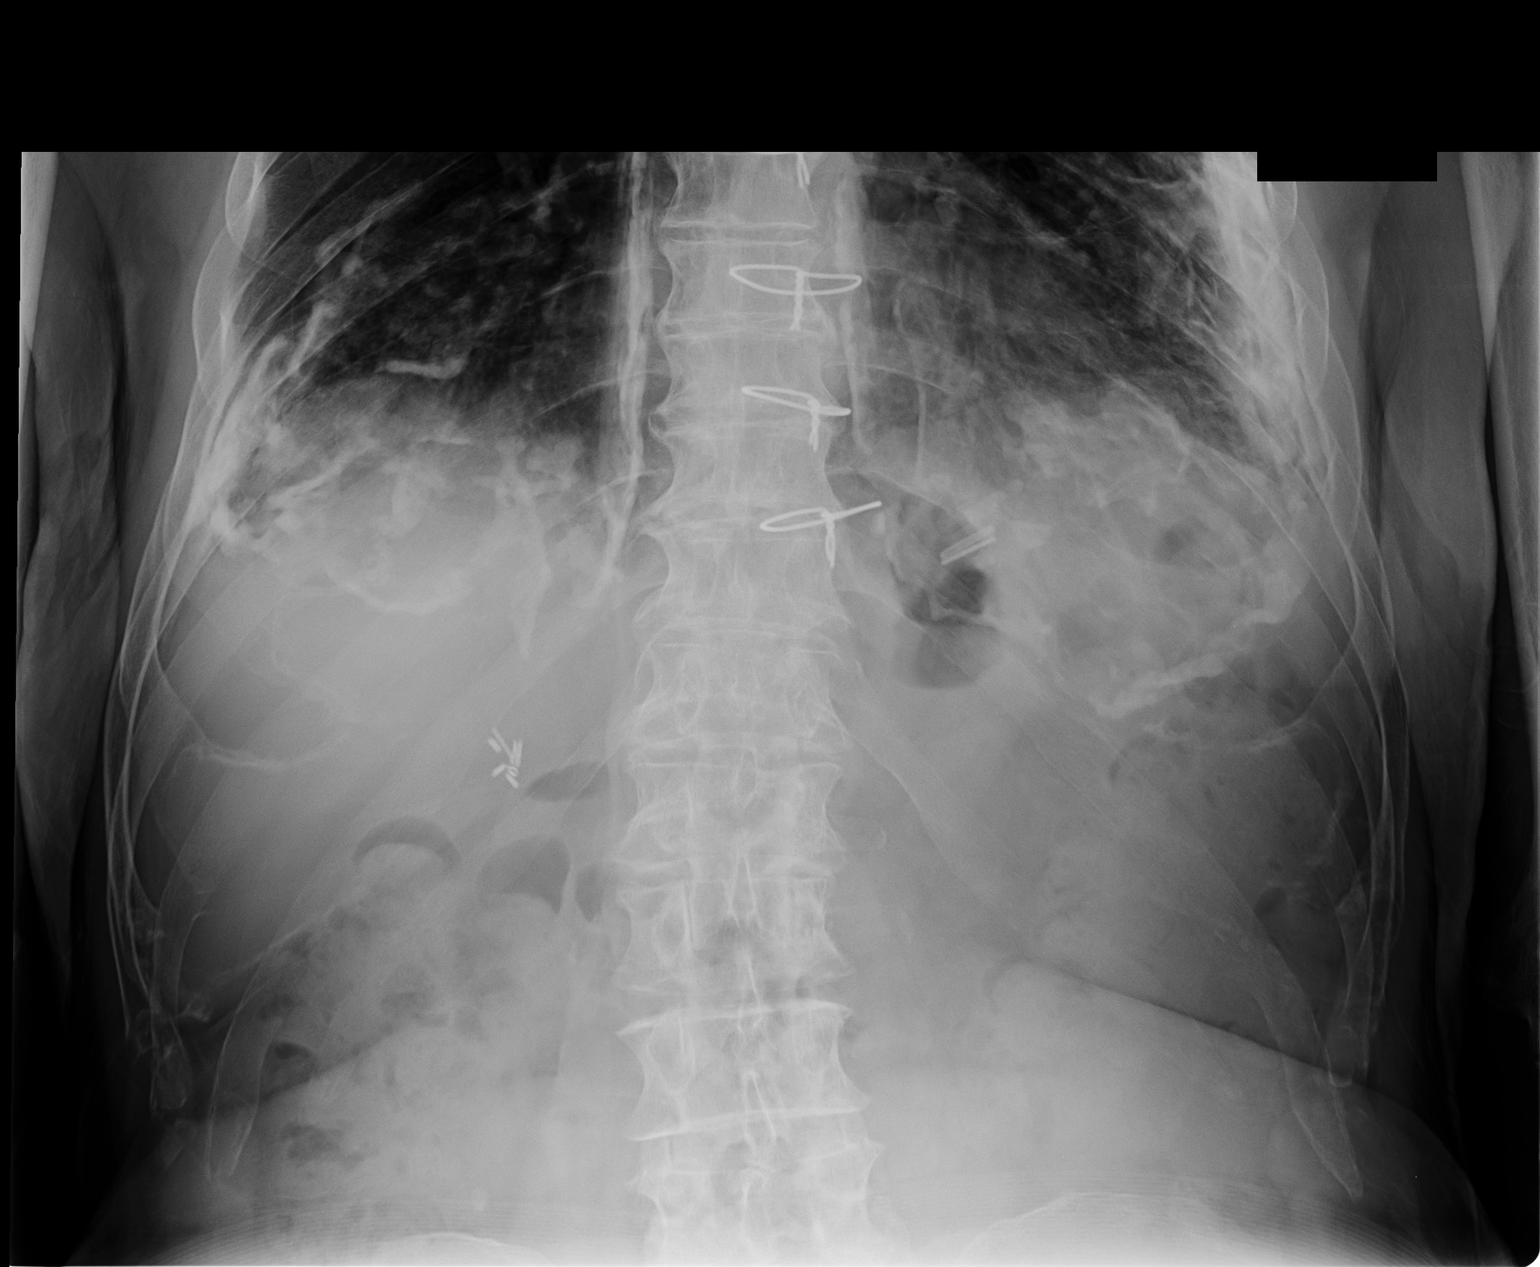

[abdomen supine kub]
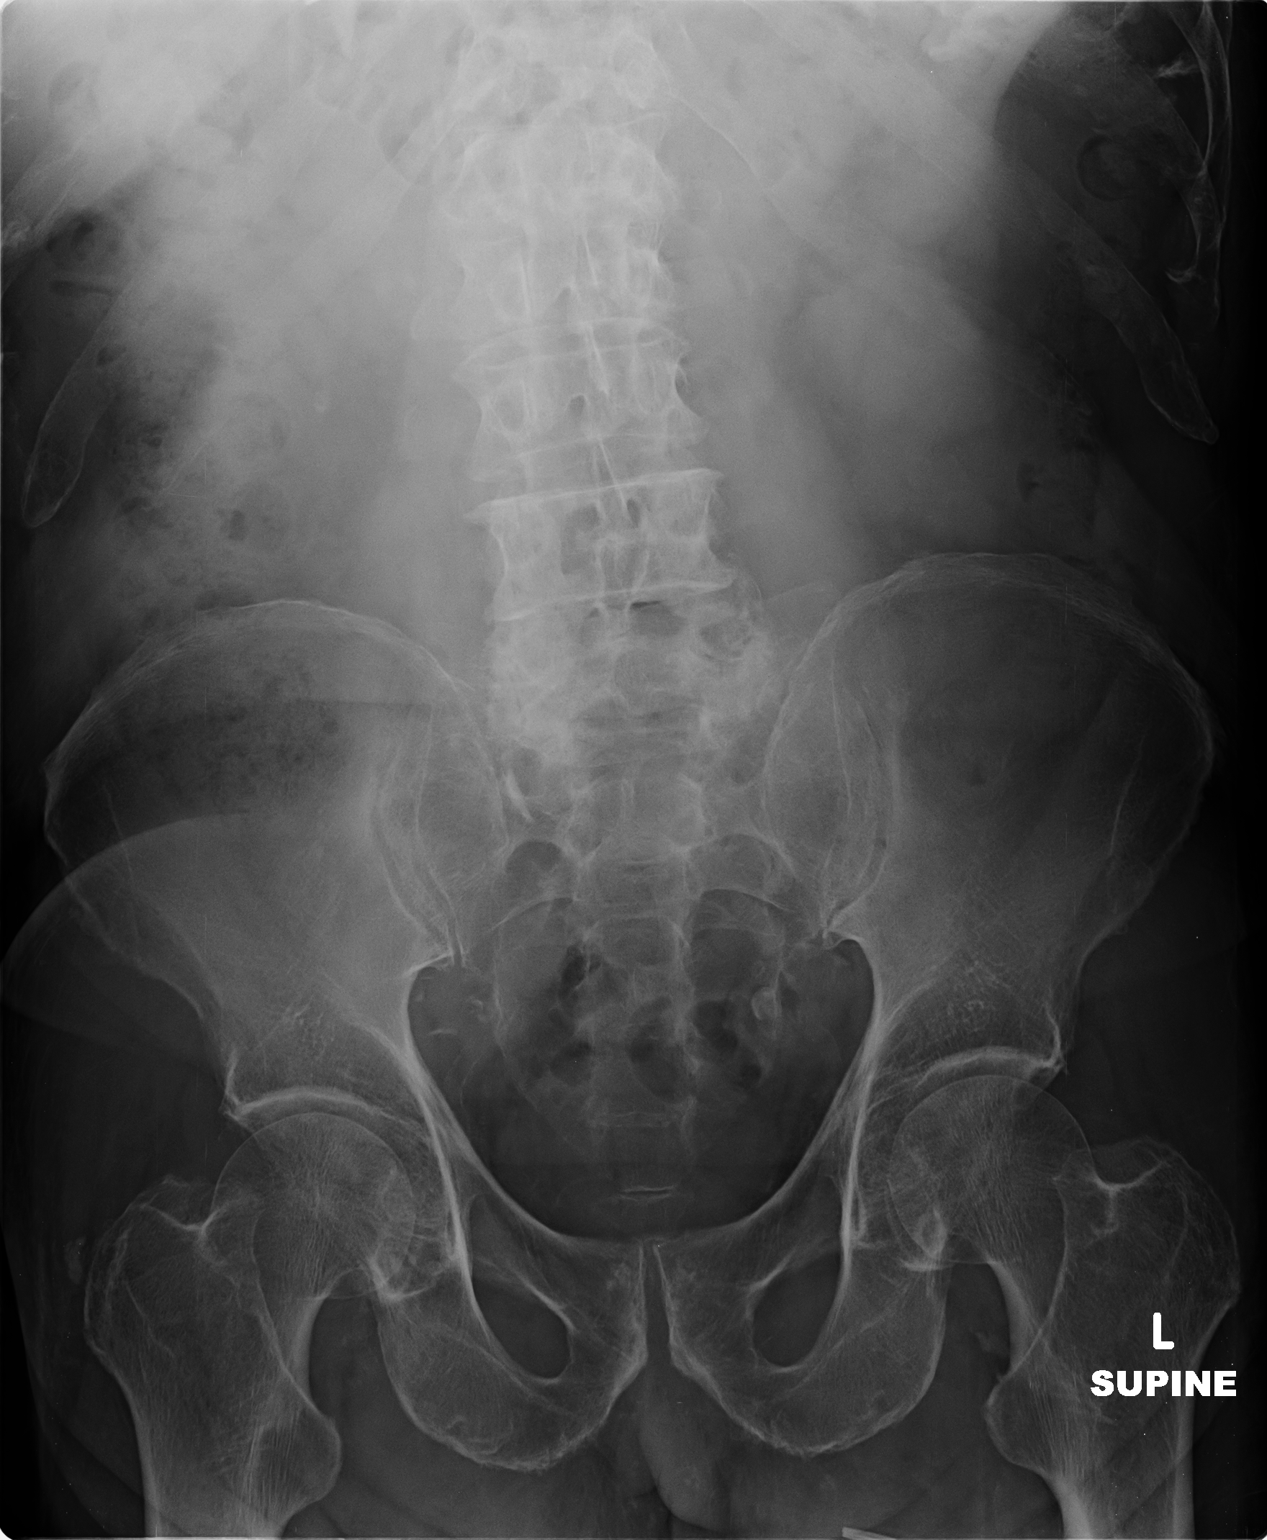

[2 of 2 positions shown; findings below may reference images not displayed]

XR abd 2v, upright and supine Sex:

EXAM
Abdomen 2 view.

INDICATION
constipation, abdominal pain
PT C/O ABD PAIN, DISTENTION. TJ

FINDINGS
Supine and upright views of the abdomen were obtained.
There is a moderate amount of stool in the ascending and transverse colon and a small amount of
stool in the descending and sigmoid colon. The small bowel is not distended. There is no evidence of
pneumoperitoneum.
There are metallic clips in the right upper abdominal quadrant.
There is extensive calcified pleural plaque bilaterally.

IMPRESSION
There is no evidence of bowel obstruction or pneumoperitoneum. There is a moderate amount of stool
in the ascending and transverse colon.
There is extensive calcified pleural plaque bilaterally.

Tech Notes:

PT C/O ABD PAIN, DISTENTION. TJ

## 2020-09-01 IMAGING — US ECHOCOMCON
1 series · 13 of 24 positions shown · non-contrast
Comparison: none

[Series 1: us echo 2d, comp w/ contrast · 84 acquisitions, 13 frames shown]
[im 1/84]
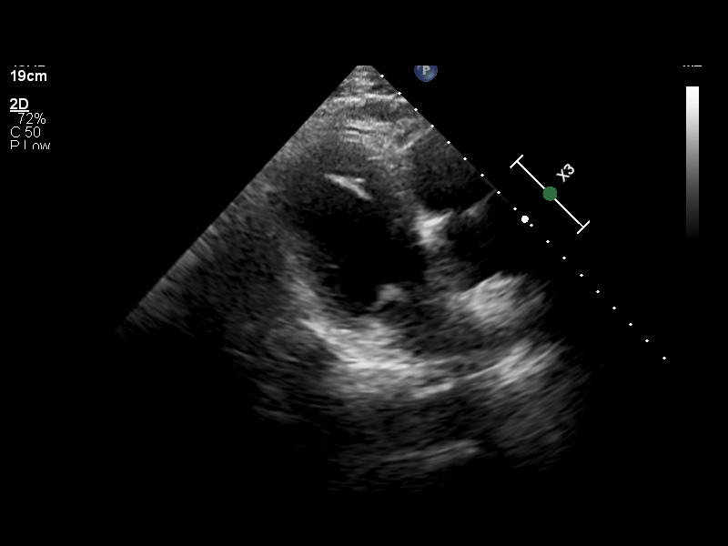
[im 8/84]
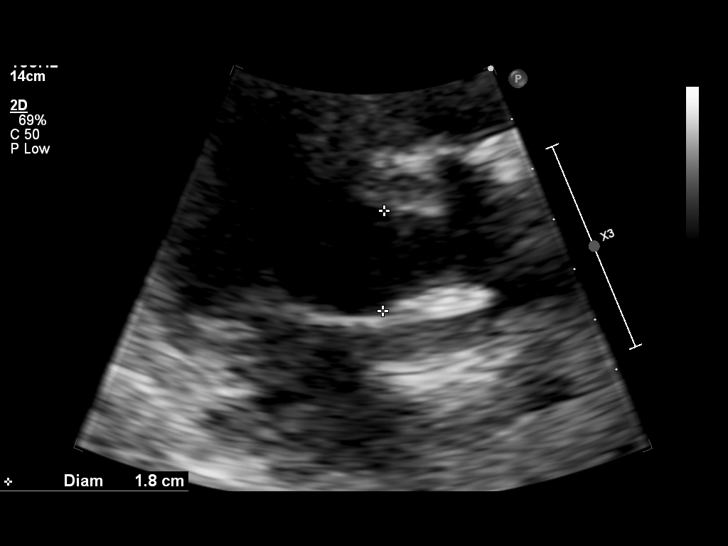
[im 15/84]
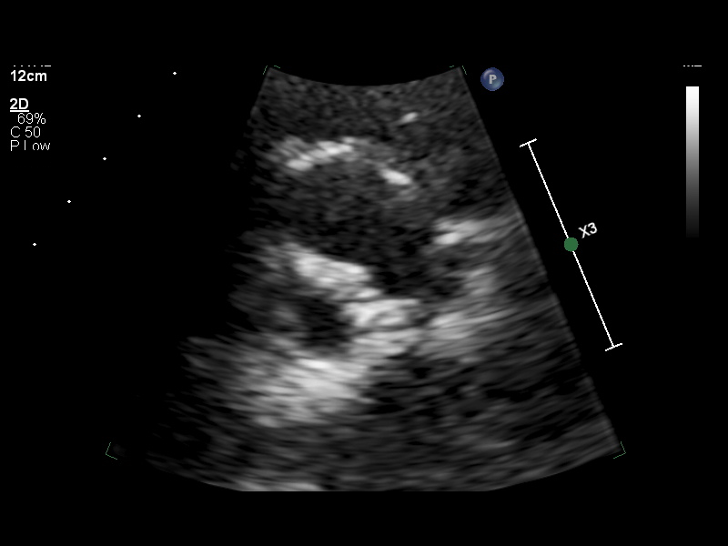
[im 22/84]
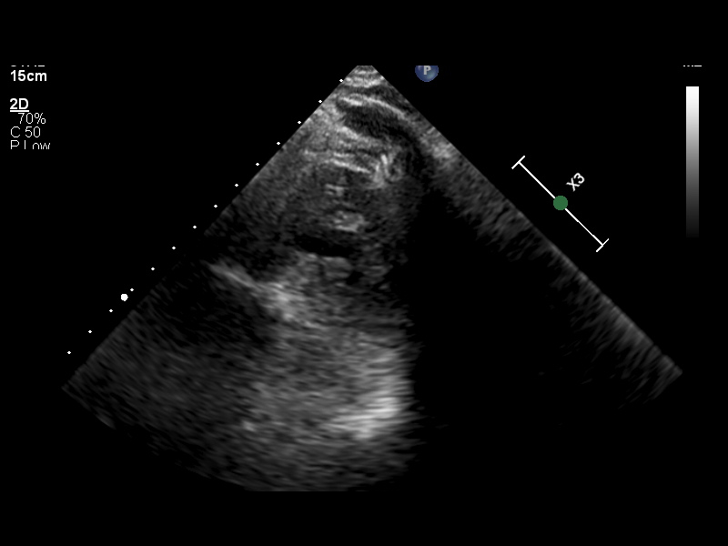
[im 29/84]
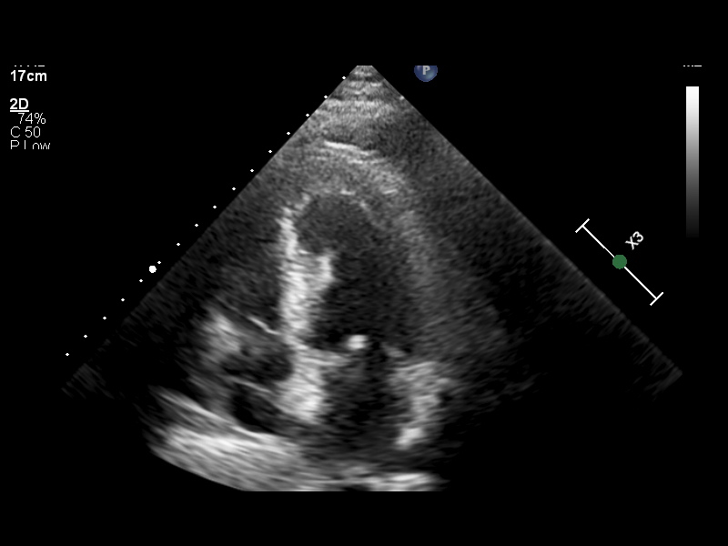
[im 37/84]
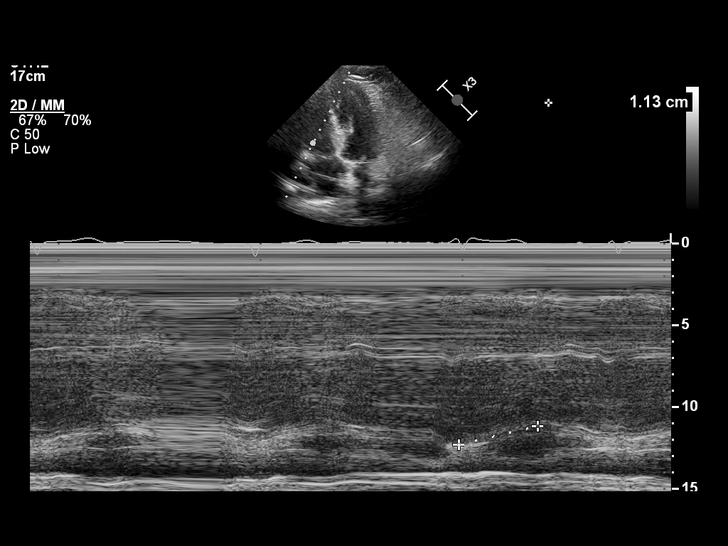
[im 47/84]
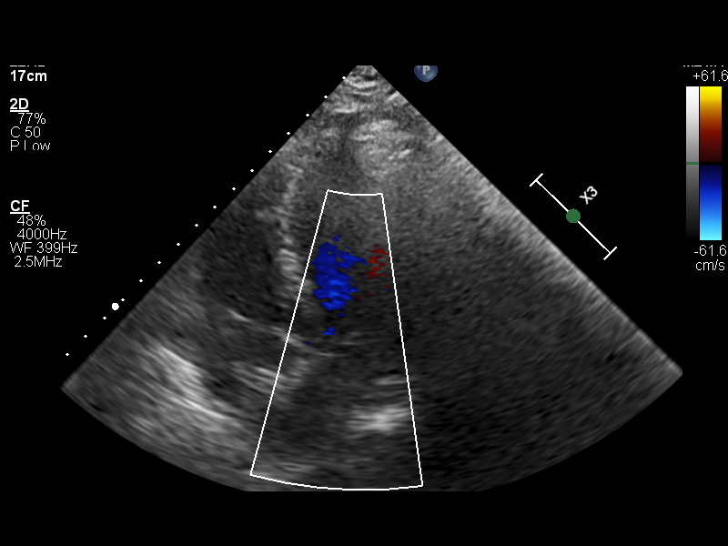
[im 47/84]
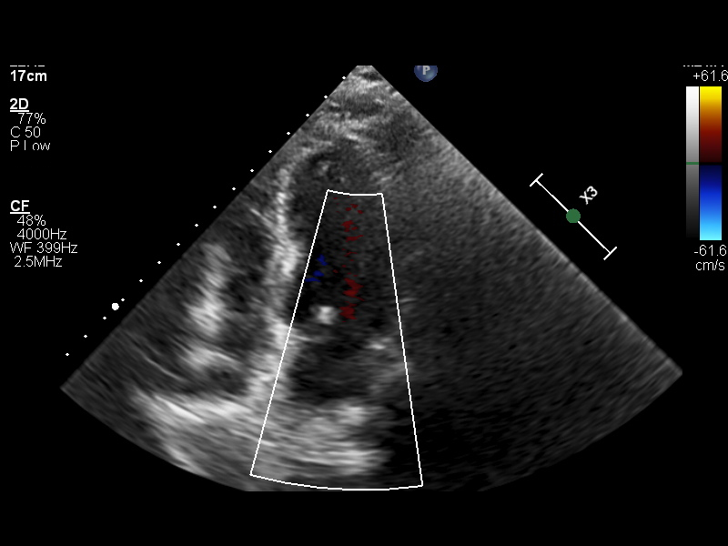
[im 58/84]
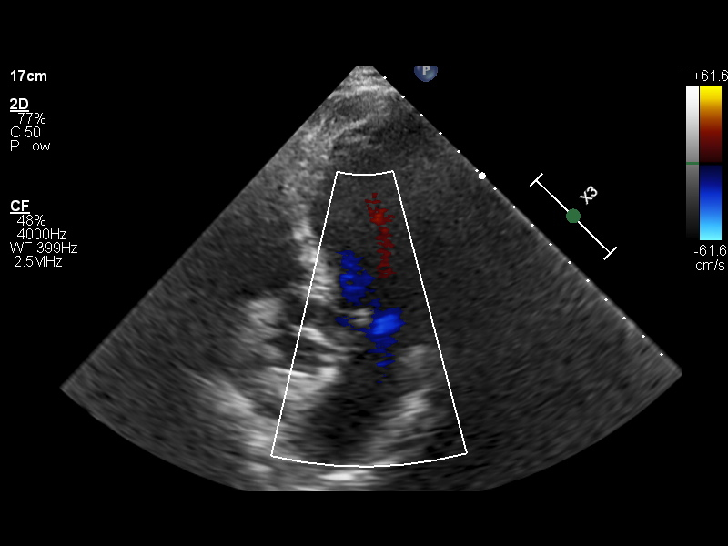
[im 65/84]
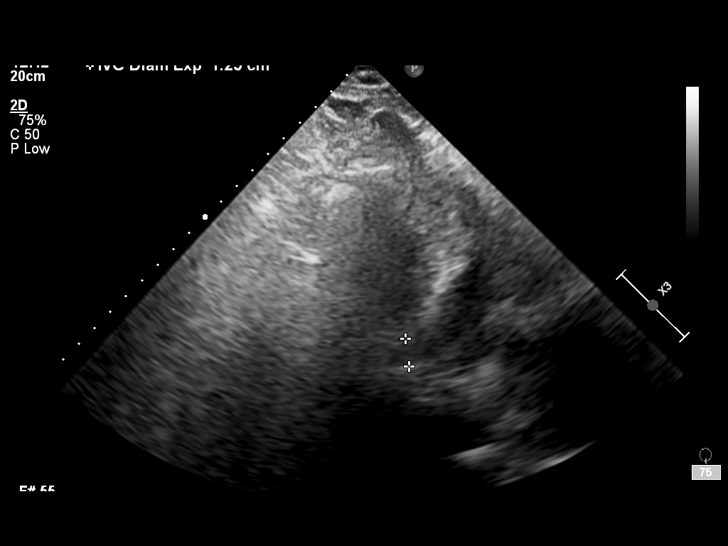
[im 73/84]
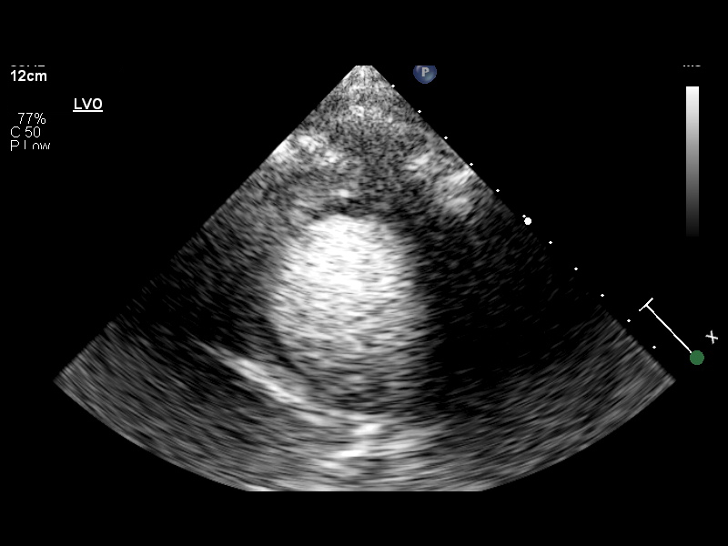
[im 76/84]
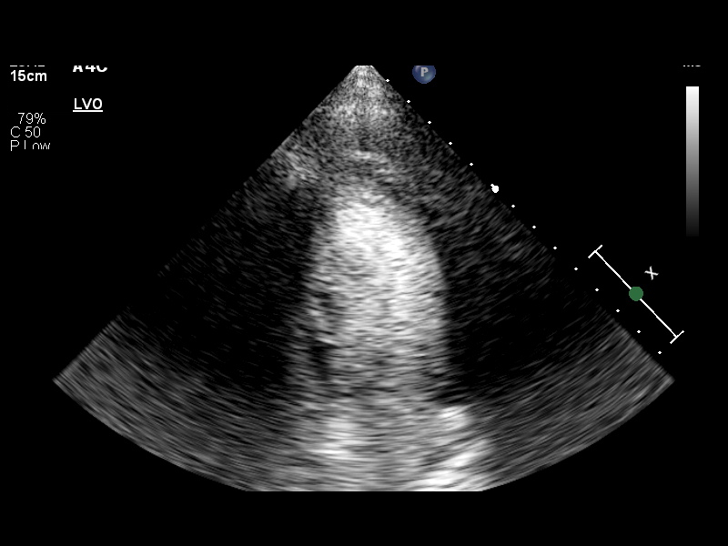
[im 84/84]
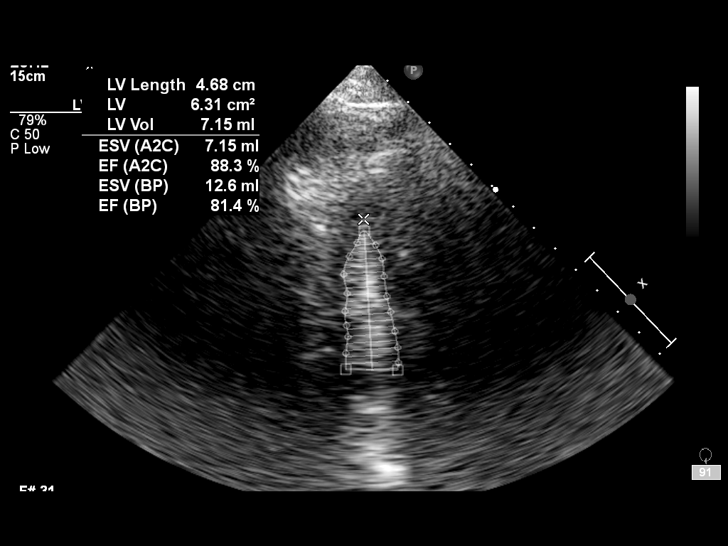

[13 of 24 positions shown; findings below may reference images not displayed]

04/01/19 -  2D + DOPPLER ECHO
Location Performed: [HOSPITAL]

Interpreting Physician: Tsoy, Dersu
Toive:
Location of Interp:
Sonographer: Tinaki Caushllari

Indications:           HX Open Heart, Dizziness, Irregular Heart Rate

Definity contrast was given to enhance imaging.

Vitals
Height   Weight   BSA (Calculated)   BP   Comments
172.7 cm (68")   70.3 kg (155 lb)   1.84   117/64

Interpretation Summary
Normal LV size, wall motion with EF 65-70%.
Grade I diastolic dysfunction.
Mild to moderate RV dysfunction with normal size, 5HNF8-V.2, RV S'=7 cm/sec.
No significant valvular abnormality.
Normal CVP   no TR jet to estimate PASP.
Normal aortic root and sinus of Valsalva.
No pericardial effusion.

No prior study available for comparison.

Echocardiographic Findings
Left Ventricle   The visually estimated ejection fraction is 65%. Abnormal septal motion consistent
with post-operative state. Grade I (mild) left ventricular diastolic dysfunction. Normal left atrial
pressure.
Right Ventricle   The right ventricular systolic function is mildly reduced. The pulmonary artery
pressure could not be estimated due to inadequate tricuspid regurgitation signal.
Left Atrium   Normal size.
Right Atrium   Normal size.
IVC/SVC   Normal central venous pressure (0-5 mm Hg).
Mitral Valve   Normal valve structure. No stenosis. No regurgitation. There is moderate mitral
annular calcification without stenosis.
Tricuspid Valve   Normal valve structure. No stenosis. No regurgitation.
Aortic Valve   The valve is sclerotic. No stenosis. No regurgitation.
Pulmonary   Normal valve structure. No regurgitation.
Aorta   Normal aorta.
Pericardium   No pericardial effusion.

Left Ventricular Wall Scoring
Score Index: 1.000   Percent Normal: 100.0%

The left ventricular wall motion is normal.

Left Heart 2D Measurements (Normal Ranges)
EF (Visual)
65 %
EF (Simpson's)
75 %
LVIDD
2.7 cm (Range: 4.2 - 5.8)
LVIDS
1.5 cm (Range: 2.5 - 4.0)
IVS
0.7 cm (Range: 0.6 - 1.0)
LV PW
0.8 cm (Range: 0.6 - 1.0)
LA Size
2.5 cm (Range: 3.0 - 4.0)

Right Heart 2D   M-Mode Measurements (Normal Ranges) (Range)
RV Basal Dia
2.2 cm (2.5 - 4.1)
RV Mid Dia
2.4 cm (1.9 - 3.5)
SERNEELS
6.2 cm2 (<18)
M-Mode TAPSE
1.1 cm (>1.7)

Left Heart 2D Addnl Measurements (Normal Ranges)
LV Systolic Vol
13 mL (Range: 21 - 61)
LV Systolic Vol Index
7 mL (Range: 11 - 31)
LV Diastolic Vol
61 mL (Range: 62 - 150)
LV Diastolic Vol Index
33 mL (Range: 34 - 74)
LA Vol
25 mL (Range: 18 - 58)
LA Vol Index
13.59 (Range: 16 - 34)
LV Mass
46 g (Range: 88 - 224)
LV Mass Index
25 g/m2 (Range: 49 - 115)
RWT
0.59 (Range: <=0.42)

Aortic Root Measurements (Normal Ranges)
Sinus
3.5 cm (Range: 2.8 - 4.0)
QIANA ERIN
2.7 cm

Doppler (Spectral and Color Flow)
Aortic valve peak velocity
1.2 m/s

Tech Notes:

## 2020-09-23 ENCOUNTER — Encounter: Admit: 2020-09-23 | Discharge: 2020-09-23 | Payer: MEDICARE

## 2020-09-23 NOTE — Telephone Encounter
09/23/2020 3:56 PM   Received call from Endrit's girlfriend Bonita Quin. She wanted to let us know that Cutter went into the [place] ED today for arm numbness. They evaluated him, did a head CT, and let him know that he was probably on the edge of having a stroke. Bonita Quin also wanted [place] to know that Sencere has not been taking his Eliquis as Dr. Iver Nestle prescribed. He has been off of it for about two months. The ED started him on Xarelto and he has picked up the prescription. Bonita Quin just wanted to make sure this was going to be okay with Dr. Iver Nestle. Will route to RKB for review.

## 2020-10-21 NOTE — Telephone Encounter
Pt called switchboard complaining of chest pain. Spoke with pt he is not having active chest pain, the chest pain is associated with physical therapy during and after band exercises that require use of his chest. He stated the pain was 10 after his physical therapy. 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 Pt also stated his chest has a dull ache most of the time. Denies CP with exertion, denies shortness of breath, denies swelling and denied weight gain.

## 2020-10-21 NOTE — Telephone Encounter
Reviewed with RKB, he would like to know if patient experiences any chest pain while walking. He recommends stress test if so. 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 Instructed pt to call if he starts to experience chest pain with exertion. Pt verbalized understanding and knows to call triage line with symptoms.

## 2020-11-27 ENCOUNTER — Encounter: Admit: 2020-11-27 | Discharge: 2020-11-27 | Payer: MEDICARE

## 2020-11-27 NOTE — Progress Notes
Andrew Chung, June 22, 1931 has an appointment with Dr. Iver Nestle on 12/08/19.    Please send recent lab results for continuity of care.    Thank you,   Andrew Chung    Phone: [phone]  Fax: [phone]

## 2020-11-30 ENCOUNTER — Encounter: Admit: 2020-11-30 | Discharge: 2020-11-30 | Payer: MEDICARE

## 2020-12-07 ENCOUNTER — Encounter: Admit: 2020-12-07 | Discharge: 2020-12-07 | Payer: MEDICARE

## 2020-12-07 ENCOUNTER — Ambulatory Visit: Admit: 2020-12-07 | Discharge: 2020-12-08 | Payer: MEDICARE

## 2020-12-07 DIAGNOSIS — I4891 Unspecified atrial fibrillation: Secondary | ICD-10-CM

## 2020-12-07 DIAGNOSIS — I2510 Atherosclerotic heart disease of native coronary artery without angina pectoris: Secondary | ICD-10-CM

## 2020-12-07 DIAGNOSIS — E119 Type 2 diabetes mellitus without complications: Secondary | ICD-10-CM

## 2020-12-07 DIAGNOSIS — I10 Essential (primary) hypertension: Secondary | ICD-10-CM

## 2020-12-07 DIAGNOSIS — T50905A Adverse effect of unspecified drugs, medicaments and biological substances, initial encounter: Secondary | ICD-10-CM

## 2020-12-07 DIAGNOSIS — J61 Pneumoconiosis due to asbestos and other mineral fibers: Secondary | ICD-10-CM

## 2020-12-07 DIAGNOSIS — K219 Gastro-esophageal reflux disease without esophagitis: Secondary | ICD-10-CM

## 2020-12-07 DIAGNOSIS — Z9229 Personal history of other drug therapy: Secondary | ICD-10-CM

## 2020-12-07 DIAGNOSIS — E785 Hyperlipidemia, unspecified: Secondary | ICD-10-CM

## 2020-12-07 DIAGNOSIS — I499 Cardiac arrhythmia, unspecified: Secondary | ICD-10-CM

## 2020-12-07 DIAGNOSIS — D649 Anemia, unspecified: Secondary | ICD-10-CM

## 2020-12-07 DIAGNOSIS — E1169 Type 2 diabetes mellitus with other specified complication: Secondary | ICD-10-CM

## 2020-12-07 DIAGNOSIS — F32A Depression: Secondary | ICD-10-CM

## 2020-12-07 DIAGNOSIS — N529 Male erectile dysfunction, unspecified: Secondary | ICD-10-CM

## 2020-12-07 DIAGNOSIS — G43909 Migraine, unspecified, not intractable, without status migrainosus: Secondary | ICD-10-CM

## 2020-12-07 MED ORDER — RIVAROXABAN 20 MG PO TAB
20 mg | ORAL_TABLET | Freq: Every day | ORAL | 1 refills | 30.00000 days | Status: AC
Start: 2020-12-07 — End: ?

## 2020-12-07 NOTE — Progress Notes
Contacted hospital Med Rec and they will forward ER visit from 09/2020.  Patient states had a TIA.

## 2020-12-08 DIAGNOSIS — I2510 Atherosclerotic heart disease of native coronary artery without angina pectoris: Secondary | ICD-10-CM

## 2020-12-11 ENCOUNTER — Encounter: Admit: 2020-12-11 | Discharge: 2020-12-11 | Payer: MEDICARE

## 2021-04-23 ENCOUNTER — Encounter: Admit: 2021-04-23 | Discharge: 2021-04-23 | Payer: MEDICARE

## 2021-04-23 NOTE — Progress Notes
Andrew Chung, [birth year] has an appointment with Dr. Iver Nestle on 05/31/21.    Please send recent lab results for continuity of care.    Thank you,   Andrew Chung    Phone: [phone]  Fax: [phone]

## 2021-04-26 ENCOUNTER — Encounter: Admit: 2021-04-26 | Discharge: 2021-04-26 | Payer: MEDICARE

## 2021-04-26 NOTE — Progress Notes
Per PCP no recent labs.

## 2021-04-30 ENCOUNTER — Encounter: Admit: 2021-04-30 | Discharge: 2021-04-30 | Payer: MEDICARE

## 2021-04-30 ENCOUNTER — Ambulatory Visit: Admit: 2021-04-30 | Discharge: 2021-04-30 | Payer: MEDICARE

## 2021-04-30 DIAGNOSIS — T50905A Adverse effect of unspecified drugs, medicaments and biological substances, initial encounter: Secondary | ICD-10-CM

## 2021-04-30 DIAGNOSIS — G43909 Migraine, unspecified, not intractable, without status migrainosus: Secondary | ICD-10-CM

## 2021-04-30 DIAGNOSIS — N529 Male erectile dysfunction, unspecified: Secondary | ICD-10-CM

## 2021-04-30 DIAGNOSIS — E1169 Type 2 diabetes mellitus with other specified complication: Secondary | ICD-10-CM

## 2021-04-30 DIAGNOSIS — F32A Depression: Secondary | ICD-10-CM

## 2021-04-30 DIAGNOSIS — I2510 Atherosclerotic heart disease of native coronary artery without angina pectoris: Secondary | ICD-10-CM

## 2021-04-30 DIAGNOSIS — E119 Type 2 diabetes mellitus without complications: Secondary | ICD-10-CM

## 2021-04-30 DIAGNOSIS — I10 Essential (primary) hypertension: Secondary | ICD-10-CM

## 2021-04-30 DIAGNOSIS — Z9229 Personal history of other drug therapy: Secondary | ICD-10-CM

## 2021-04-30 DIAGNOSIS — I4891 Unspecified atrial fibrillation: Secondary | ICD-10-CM

## 2021-04-30 DIAGNOSIS — J61 Pneumoconiosis due to asbestos and other mineral fibers: Secondary | ICD-10-CM

## 2021-04-30 DIAGNOSIS — D649 Anemia, unspecified: Secondary | ICD-10-CM

## 2021-04-30 DIAGNOSIS — I480 Paroxysmal atrial fibrillation: Secondary | ICD-10-CM

## 2021-04-30 DIAGNOSIS — E785 Hyperlipidemia, unspecified: Secondary | ICD-10-CM

## 2021-04-30 DIAGNOSIS — K219 Gastro-esophageal reflux disease without esophagitis: Secondary | ICD-10-CM

## 2021-04-30 NOTE — Progress Notes
Date of Service: 04/30/2021    Andrew SEARS Sr. is a 87 y.o. male.       HPI     Andrew Chung is a very pleasant 87 year old gentleman, coming in for a follow-up regarding his history of CAD, paroxysmal atrial fibrillation and dyslipidemia.  ?  He denies any chest pain or undue shortness of breath.  He lives alone at home and is not eating well.  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  On a lot of days, he will come home and just open up a can of soup and that will be the only meal of the day.  He has lost another 10 pounds since I saw him in April.      He has had PCI's and then had a two-vessel bypass in 2012 using a mammary to the LAD and a vein graft to the RCA.  As mentioned above, he has not been having any adverse cardiac symptoms.      He has had occasional dizziness.  He denies any palpitations.  He is taking the Xarelto and atorvastatin on a regular basis but not on any other cardiac medications that would make him hypotensive.    ?  He does not smoke.           Vitals:    04/30/21 1104   BP: 105/66   BP Source: Arm, Left Upper   Pulse: 70   SpO2: 96%   PainSc: Zero   Weight: 67.8 kg (149 lb 6.4 oz)   Height: 165.1 cm (5' 5)     Body mass index is 24.86 kg/m?Marland Kitchen     Past Medical History  Patient Active Problem List    Diagnosis Date Noted   ? Vertigo 09/11/2013     08/24/13 CT Head w/o Contrast: [facility]:  1. Mild to moderate chronic periventricular ischemic changes.  2. No acute appearing intracranial process.       ? PVC's (premature ventricular contractions) 05/14/2013   ? Sinus bradycardia 04/09/2013     8/14 treadmill testing Underlying rhythm is sinus brady with isolated PVCs. The pt exercised on a Std Bruce protocol. The HR increased to the 60s & 70s quickly. It was 100-110/min by the end of Stg 1. The peak HR was 137/min (90% of MPHR) at the end of 6 min, No ST depressions were seen, The PVCs were suppressed during exercise.  8/14 HR 45/min, no symptoms. TSH normal in 8/14.     ? Pre-syncope 11/03/2011     2/13 seen at the [facility] hosp ER, ACEI & bb d/ced as the HR was in the 40s & BP was also low     ? CVA (cerebral infarction) 07/04/2011     10/12 MRI done for headaches- showed a small CVA per pt.     ? PAF (paroxysmal atrial fibrillation) (HCC) 11/18/2010       09/2020-[facility], possible TIA, noncompliance with Eliquis  7/12 off amio  Developed atrial fibrillation post-CABG phase. Rx with amiodarone.     ? 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 GERD (gastroesophageal reflux disease) 08/06/2009   ? Urethral stricture 08/06/2009   ? ED (erectile dysfunction) 08/06/2009   ? HTN (hypertension) 03/11/2009   ? CAD (coronary artery disease) 02/17/2009  5/13 Rega thallium -ve for ischemia. Done for evaluation of CPs after CABG  1.  01/07 BMS distal LAD3x12, BMS distal LCF 2-25x12, prox LAD 50% Rx medically  2.  08/09 stress thall EF 66% normal  3.  06/10 Echo EF 60%, mild LVH, chambers normal, mild aortic sclerosis, PAP 27  4.  07/10 chest pain.  Cath at [place]- LM normal , LAD prox 50% mid 60%, distal stent 20%, LCX stants 20%,    RCA chronic 99% stenosis.  FFR negative in LAD EF 55% Rx medically  5.  3/12 CABG 2 V bypass     ? Diabetes mellitus type 2 in obese (HCC) 02/17/2009   ? Dyslipidemia 02/17/2009     5/13 LDL 101  5/12 excellent profile except for TG at 202     ? Pulmonary asbestosis (HCC) 02/17/2009     F/u with pulmonary at [facility]           Review of Systems   Constitutional: Negative.   HENT: Negative.    Eyes: Negative.    Cardiovascular: Positive for chest pain.   Respiratory: Negative.    Endocrine: Negative.    Hematologic/Lymphatic: Negative.    Skin: Negative.    Gastrointestinal: Negative.    Genitourinary: Negative.    Neurological: Positive for light-headedness.   Psychiatric/Behavioral: Negative.    Allergic/Immunologic: Negative.        Physical Exam  General Appearance: well for age, appears comfortable  Skin: warm, no ulcers, no xanthomas  Eyes/lids: no conjunctival pallor, no arcus, no xanthelasma  Lips/oral mucosa: no cyanosis  Neck: neck veins flat  Carotids: normal upstroke, no bruit  Chest: normal appearance  Lungs: clear  Cardiac rhythm: regular rhythm & normal rate  Cardiac auscultation: Normal S1 & S2, no S3 or S4, no murmur  Abdomen: soft, non tender, bowel sounds normal, no pulsations/bruits  Extremities: no LE edema, no varicosities, 2+ distal pulses  Neurologic/psych: oriented, no gross motor deficits, normal gait, normal mood & affect       EKG shows sinus rhythm with PACs, no acute ischemic changes, left ventricular hypertrophy  Cardiovascular Health Factors  Vitals BP Readings from Last 3 Encounters:   04/30/21 105/66   12/07/20 130/86   03/06/20 (!) 160/80     Wt Readings from Last 3 Encounters:   04/30/21 67.8 kg (149 lb 6.4 oz)   12/07/20 72.2 kg (159 lb 3.2 oz)   03/06/20 71.2 kg (156 lb 14.4 oz)     BMI Readings from Last 3 Encounters:   04/30/21 24.86 kg/m?   12/07/20 24.21 kg/m?   03/06/20 23.86 kg/m?      Smoking Social History     Tobacco Use   Smoking Status Former Smoker   ? Quit date: 02/18/1959   ? Years since quitting: 62.2   Smokeless Tobacco Former Neurosurgeon   ? Types: Chew   ? Quit date: 04/10/1963      Lipid Profile Cholesterol   Date Value Ref Range Status   01/08/2019 142  Final     HDL   Date Value Ref Range Status   01/08/2019 60  Final     LDL   Date Value Ref Range Status   01/08/2019 59  Final     Triglycerides   Date Value Ref Range Status   01/08/2019 115  Final      Blood Sugar Hemoglobin A1C   Date Value Ref Range Status   10/07/2010 7.0 (H) 4.0 - 6.0 % Final  Comment:     NOTE NEW REFERENCE RANGES  For patients with Type I or Type II Diabetes Mellitus, the ADA recommends   maintaining the A1c level <7%.     Glucose   Date Value Ref Range Status   07/21/2020 145  Final   01/22/2020 137  Final   06/14/2019 160  Final Glucose Fasting   Date Value Ref Range Status   03/11/2009 144 (H) 70 - 100 MG/DL Final     Glucose, POC   Date Value Ref Range Status   10/21/2010 178 (H) 70 - 100 MG/DL Final   16/06/9603 540 (H) 70 - 100 MG/DL Final   98/07/9146 829 (H) 70 - 100 MG/DL Final          Problems Addressed Today  Encounter Diagnoses   Name Primary?   ? Coronary artery disease involving native coronary artery of native heart without angina pectoris Yes   ? PAF (paroxysmal atrial fibrillation) (HCC)        Assessment and Plan    ?In summary, Mr. Andrew Chung is an 87 year old gentleman, looks much younger than his stated age, here with the underlying cardiovascular and related issues:   ??  1. Hypertension,?currently off all medications.  He has lost quite a bit of weight.  He has had dizziness, falls and prior history of bradycardia.  No medications required at this time. dycardia?and falls. ??  2. Dyslipidemia,?we do not have a recent cholesterol profile on him.  It is followed by the primary team.    On atorvastatin.      3. Coronary disease, prior bypass surgery/pcis, with?no?recurrence of chest pain despite strenuous physical activity.  The last stress test was in September 2020 and it showed no evidence of myocardial ischemia.  The LV systolic function has been normal  4. Paroxysmal atrial fibrillation,?asymptomatic, incidentally found?on a?Holter monitor.   CHA2DS2-VASc score is high on account of prior TIAs.  Continue on Eliquis.     ??  ?  It was a pleasure to see Andrew Chung in the clinic today.  ?           Current Medications (including today's revisions)  ? atorvastatin (LIPITOR) 20 mg tablet Take one tablet by mouth daily.   ? canagliflozin (INVOKANA) 100 mg tablet Take 100 mg by mouth daily with breakfast.   ? Cholecalciferol (Vitamin D3) 50 mcg (2,000 unit) capsule Take 2,000 Units by mouth daily.   ? Cyanocobalamin 250 mcg tab Take 1 tablet by mouth daily.   ? nitroglycerin (NITROSTAT) 0.4 mg tablet For CHEST PAIN, sit down, place 1 tab under tongue, may repeat 2 more times if chest pain persists. If unrelieved, call 911, go to ER   ? rivaroxaban (XARELTO) 20 mg tablet Take one tablet by mouth daily. Take with food.   ? tamsulosin (FLOMAX) 0.4 mg capsule Take 0.4 mg by mouth at bedtime daily. Do not crush, chew or open capsules. Take 30 minutes following the same meal each day.   ? zinc sulfate 220 mg (50 mg elemental zinc) capsule Take 220 mg by mouth daily.

## 2021-10-04 ENCOUNTER — Encounter: Admit: 2021-10-04 | Discharge: 2021-10-04 | Payer: MEDICARE

## 2021-10-04 NOTE — Progress Notes
Andrew Chung, Jan 19, 1931 has an appointment with Dr. Iver Nestle on 10/06/21.    Please send recent lab results for continuity of care.    Thank you,   Andrew Chung    Phone: [phone]  Fax: [phone]

## 2021-10-05 ENCOUNTER — Encounter: Admit: 2021-10-05 | Discharge: 2021-10-05 | Payer: MEDICARE

## 2021-10-06 ENCOUNTER — Ambulatory Visit: Admit: 2021-10-06 | Discharge: 2021-10-06 | Payer: MEDICARE

## 2021-10-06 ENCOUNTER — Encounter: Admit: 2021-10-06 | Discharge: 2021-10-06 | Payer: MEDICARE

## 2021-10-06 VITALS — BP 137/67 | HR 70

## 2021-10-06 VITALS — BP 144/87 | HR 62 | Ht 68.0 in | Wt 152.2 lb

## 2021-10-06 DIAGNOSIS — T50905A Adverse effect of unspecified drugs, medicaments and biological substances, initial encounter: Secondary | ICD-10-CM

## 2021-10-06 DIAGNOSIS — E785 Hyperlipidemia, unspecified: Secondary | ICD-10-CM

## 2021-10-06 DIAGNOSIS — J61 Pneumoconiosis due to asbestos and other mineral fibers: Secondary | ICD-10-CM

## 2021-10-06 DIAGNOSIS — D649 Anemia, unspecified: Secondary | ICD-10-CM

## 2021-10-06 DIAGNOSIS — Z9229 Personal history of other drug therapy: Secondary | ICD-10-CM

## 2021-10-06 DIAGNOSIS — F32A Depression: Secondary | ICD-10-CM

## 2021-10-06 DIAGNOSIS — E1169 Type 2 diabetes mellitus with other specified complication: Secondary | ICD-10-CM

## 2021-10-06 DIAGNOSIS — K219 Gastro-esophageal reflux disease without esophagitis: Secondary | ICD-10-CM

## 2021-10-06 DIAGNOSIS — I480 Paroxysmal atrial fibrillation: Principal | ICD-10-CM

## 2021-10-06 DIAGNOSIS — I10 Essential (primary) hypertension: Secondary | ICD-10-CM

## 2021-10-06 DIAGNOSIS — I4891 Unspecified atrial fibrillation: Secondary | ICD-10-CM

## 2021-10-06 DIAGNOSIS — I2510 Atherosclerotic heart disease of native coronary artery without angina pectoris: Secondary | ICD-10-CM

## 2021-10-06 DIAGNOSIS — N529 Male erectile dysfunction, unspecified: Secondary | ICD-10-CM

## 2021-10-06 DIAGNOSIS — E119 Type 2 diabetes mellitus without complications: Secondary | ICD-10-CM

## 2021-10-06 DIAGNOSIS — G43909 Migraine, unspecified, not intractable, without status migrainosus: Secondary | ICD-10-CM

## 2021-10-06 NOTE — Progress Notes
Date of Service: 10/06/2021    Andrew BERDAN Sr. is a 88 y.o. male.       HPI     Andrew Chung is a very pleasant 87 year old gentleman, coming in for continued care of his CAD, paroxysmal atrial fibrillation and dyslipidemia.  ?  He is still farming full-time.  Fortunately, he has not lost any more weight.  He denies any chest pain or undue shortness of breath.  There have been no falls lately.  There has been no fainting.  The dizzy episodes are few and far between.    There is history of documented coronary disease.  He has had PCI's and then had a two-vessel bypass in 2012 using a mammary to the LAD and a vein graft to the RCA.       He is taking the Xarelto and atorvastatin on a regular basis but not on any other cardiac medications that would make him hypotensive.    ?  He does not smoke.           Vitals:    10/06/21 1538 10/06/21 1612   BP: (!) 144/87 137/67   BP Source: Arm, Left Upper Arm, Left Upper   Pulse: 62 70   SpO2: 98%    PainSc: Zero    Weight: 69 kg (152 lb 3.2 oz)    Height: 172.7 cm (5' 8)      Body mass index is 23.14 kg/m?Marland Kitchen     Past Medical History  Patient Active Problem List    Diagnosis Date Noted   ? Vertigo 09/11/2013     08/24/13 CT Head w/o Contrast: [facility]:  1. Mild to moderate chronic periventricular ischemic changes.  2. No acute appearing intracranial process.       ? PVC's (premature ventricular contractions) 05/14/2013   ? Sinus bradycardia 04/09/2013     8/14 treadmill testing Underlying rhythm is sinus brady with isolated PVCs. The pt exercised on a Std Bruce protocol. The HR increased to the 60s & 70s quickly. It was 100-110/min by the end of Stg 1. The peak HR was 137/min (90% of MPHR) at the end of 6 min, No ST depressions were seen, The PVCs were suppressed during exercise.  8/14 HR 45/min, no symptoms. TSH normal in 8/14.     ? Pre-syncope 11/03/2011     2/13 seen at the [facility] hosp ER, ACEI & bb d/ced as the HR was in the 40s & BP was also low     ? CVA (cerebral infarction) 07/04/2011     10/12 MRI done for headaches- showed a small CVA per pt.     ? PAF (paroxysmal atrial fibrillation) (HCC) 11/18/2010       09/2020-[facility], possible TIA, noncompliance with Eliquis  7/12 off amio  Developed atrial fibrillation post-CABG phase. Rx with amiodarone.     ? 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 GERD (gastroesophageal reflux disease) 08/06/2009   ? Urethral stricture 08/06/2009   ? ED (erectile dysfunction) 08/06/2009   ? HTN (hypertension) 03/11/2009   ? CAD (coronary artery disease) 02/17/2009     5/13 Rega thallium -ve for ischemia. Done for evaluation of CPs after CABG  1.  01/07 BMS distal LAD3x12, BMS distal LCF 2-25x12, prox LAD 50% Rx medically  2.  08/09 stress thall EF 66% normal  3.  06/10 Echo EF 60%, mild LVH, chambers normal, mild aortic sclerosis, PAP 27  4.  07/10 chest pain.  Cath at [place]- LM normal , LAD prox 50% mid 60%, distal stent 20%, LCX stants 20%,    RCA chronic 99% stenosis.  FFR negative in LAD EF 55% Rx medically  5.  3/12 CABG 2 V bypass     ? Diabetes mellitus type 2 in obese (HCC) 02/17/2009   ? Dyslipidemia 02/17/2009     5/13 LDL 101  5/12 excellent profile except for TG at 202     ? Pulmonary asbestosis (HCC) 02/17/2009     F/u with pulmonary at [facility]           Review of Systems   Constitutional: Negative.   HENT: Negative.    Eyes: Negative.    Respiratory: Negative.    Endocrine: Negative.    Hematologic/Lymphatic: Negative.    Skin: Negative.    Gastrointestinal: Negative.    Genitourinary: Negative.    Neurological: Positive for light-headedness and loss of balance.   Psychiatric/Behavioral: Negative.    Allergic/Immunologic: Negative.        Physical Exam  General Appearance: well for age, appears comfortable  Skin: warm, no ulcers, no xanthomas  Eyes/lids: no conjunctival pallor, no arcus, no xanthelasma  Lips/oral mucosa: no cyanosis  Neck: neck veins flat  Carotids: normal upstroke, no bruit  Chest: normal appearance  Lungs: clear  Cardiac rhythm: regular rhythm & normal rate  Cardiac auscultation: Normal S1 & S2, no S3 or S4, no murmur  Abdomen: soft, non tender, bowel sounds normal, no pulsations/bruits  Extremities: no LE edema, no varicosities, 2+ distal pulses  Neurologic/psych: oriented, no gross motor deficits, normal gait, normal mood & affect       EKG shows sinus rhythm with PACs, no acute ischemic changes  Cardiovascular Health Factors  Vitals BP Readings from Last 3 Encounters:   10/06/21 137/67   04/30/21 105/66   12/07/20 130/86     Wt Readings from Last 3 Encounters:   10/06/21 69 kg (152 lb 3.2 oz)   04/30/21 67.8 kg (149 lb 6.4 oz)   12/07/20 72.2 kg (159 lb 3.2 oz)     BMI Readings from Last 3 Encounters:   10/06/21 23.14 kg/m?   04/30/21 24.86 kg/m?   12/07/20 24.21 kg/m?      Smoking Social History     Tobacco Use   Smoking Status Former   Smokeless Tobacco Former   ? Types: Chew   ? Quit date: 04/10/1963      Lipid Profile Cholesterol   Date Value Ref Range Status   01/08/2019 142  Final     HDL   Date Value Ref Range Status   01/08/2019 60  Final     LDL   Date Value Ref Range Status   01/08/2019 59  Final     Triglycerides   Date Value Ref Range Status   01/08/2019 115  Final      Blood Sugar Hemoglobin A1C   Date Value Ref Range Status   10/07/2010 7.0 (H) 4.0 - 6.0 % Final     Comment:     NOTE NEW REFERENCE RANGES  For patients with Type I or Type II Diabetes Mellitus, the ADA recommends   maintaining the A1c level <7%.     Glucose   Date Value Ref Range Status   07/26/2021 195  Final   07/21/2020 145  Final   01/22/2020 137  Final     Glucose Fasting   Date Value Ref Range Status   03/11/2009 144 (H) 70 - 100 MG/DL Final     Glucose, POC   Date Value Ref Range Status   10/21/2010 178 (H) 70 - 100 MG/DL Final   14/78/2956 213 (H) 70 - 100 MG/DL Final   08/65/7846 962 (H) 70 - 100 MG/DL Final          Problems Addressed Today  Encounter Diagnoses   Name Primary?   ? PAF (paroxysmal atrial fibrillation) (HCC) Yes   ? Coronary artery disease involving native coronary artery of native heart without angina pectoris    ? Primary hypertension        Assessment and Plan    ?In summary, [name] is an 88 year old gentleman, looks much younger than his stated age, here with the underlying cardiovascular and related issues:   ??  1. Hypertension,?currently off all antihypertensives.  The blood pressure satisfactory for his age.  Given his prior history of dizziness and falls, no blood pressure medications will be used.   ??  2. Dyslipidemia,?on atorvastatin 20 mg daily.  Levels are followed by the primary team.      3. Coronary disease, prior bypass surgery/pcis, with?no?chest pain despite strenuous physical activity.  The stress test in 2020 was negative for ischemia.  The LV systolic function has been normal  4. Paroxysmal atrial fibrillation,?asymptomatic, incidentally found?on a?Holter monitor.   CHA2DS2-VASc score is high on account of prior TIAs/CAD.  Continue on Xarelto.     ??  ?  It was a pleasure to see Andrew Chung in the clinic today.  ?           Current Medications (including today's revisions)  ? atorvastatin (LIPITOR) 20 mg tablet Take one tablet by mouth daily.   ? canagliflozin (INVOKANA) 100 mg tablet Take 100 mg by mouth daily with breakfast.   ? Cholecalciferol (Vitamin D3) 50 mcg (2,000 unit) capsule Take 2,000 Units by mouth daily.   ? Cyanocobalamin 250 mcg tab Take 1 tablet by mouth daily.   ? nitroglycerin (NITROSTAT) 0.4 mg tablet For CHEST PAIN, sit down, place 1 tab under tongue, may repeat 2 more times if chest pain persists. If unrelieved, call 911, go to ER   ? rivaroxaban (XARELTO) 20 mg tablet Take one tablet by mouth daily. Take with food.   ? tamsulosin (FLOMAX) 0.4 mg capsule Take 0.4 mg by mouth at bedtime daily. Do not crush, chew or open capsules. Take 30 minutes following the same meal each day.   ? zinc sulfate 220 mg (50 mg elemental zinc) capsule Take 220 mg by mouth daily.

## 2022-03-29 ENCOUNTER — Encounter: Admit: 2022-03-29 | Discharge: 2022-03-29 | Payer: MEDICARE

## 2022-03-29 NOTE — Progress Notes
Andrew Chung, 21-Oct-1930 has an appointment with Dr. Iver Nestle on 04/05/22.    Please send recent lab results for continuity of care.    Thank you,   Cassadie Pankonin    Phone: [phone]  Fax: [phone]

## 2022-04-05 ENCOUNTER — Ambulatory Visit: Admit: 2022-04-05 | Discharge: 2022-04-06 | Payer: MEDICARE

## 2022-04-05 ENCOUNTER — Encounter: Admit: 2022-04-05 | Discharge: 2022-04-05 | Payer: MEDICARE

## 2022-04-05 DIAGNOSIS — I4891 Unspecified atrial fibrillation: Secondary | ICD-10-CM

## 2022-04-05 DIAGNOSIS — E785 Hyperlipidemia, unspecified: Secondary | ICD-10-CM

## 2022-04-05 DIAGNOSIS — I2510 Atherosclerotic heart disease of native coronary artery without angina pectoris: Secondary | ICD-10-CM

## 2022-04-05 DIAGNOSIS — N529 Male erectile dysfunction, unspecified: Secondary | ICD-10-CM

## 2022-04-05 DIAGNOSIS — T50905A Adverse effect of unspecified drugs, medicaments and biological substances, initial encounter: Secondary | ICD-10-CM

## 2022-04-05 DIAGNOSIS — I10 Essential (primary) hypertension: Secondary | ICD-10-CM

## 2022-04-05 DIAGNOSIS — E1169 Type 2 diabetes mellitus with other specified complication: Secondary | ICD-10-CM

## 2022-04-05 DIAGNOSIS — Z9229 Personal history of other drug therapy: Secondary | ICD-10-CM

## 2022-04-05 DIAGNOSIS — J61 Pneumoconiosis due to asbestos and other mineral fibers: Secondary | ICD-10-CM

## 2022-04-05 DIAGNOSIS — F32A Depression: Secondary | ICD-10-CM

## 2022-04-05 DIAGNOSIS — G43909 Migraine, unspecified, not intractable, without status migrainosus: Secondary | ICD-10-CM

## 2022-04-05 DIAGNOSIS — E119 Type 2 diabetes mellitus without complications: Secondary | ICD-10-CM

## 2022-04-05 DIAGNOSIS — I480 Paroxysmal atrial fibrillation: Secondary | ICD-10-CM

## 2022-04-05 DIAGNOSIS — D649 Anemia, unspecified: Secondary | ICD-10-CM

## 2022-04-05 DIAGNOSIS — K219 Gastro-esophageal reflux disease without esophagitis: Secondary | ICD-10-CM

## 2022-04-05 MED ORDER — SILDENAFIL 50 MG PO TAB
50 mg | ORAL_TABLET | ORAL | 3 refills | 45.00000 days | Status: AC | PRN
Start: 2022-04-05 — End: ?

## 2022-04-05 NOTE — Patient Instructions
Thank you for visiting our office today.   We recommend follow-up with our office in 6 months.   Please call [phone] in 1 month to schedule the office visit Dr. Iver Nestle ordered.     Please complete the following orders:     Check lab work at your earliest convenience. You do need to be fasting when lab is drawn. (Nothing except water and prescription medications for 12 hrs before blood is drawn.)    Take your medications as ordered.   Check your list with what you have on hand at home.   Should you have any additional questions or concerns, please message me through MyChart or call the office.    Cardiovascular Medicine Team   Clinic phone: [phone]

## 2022-04-05 NOTE — Progress Notes
Date of Service: 04/05/2022    Andrew Abbe Sr. is a 88 y.o. male.       HPI     Andrew Chung is a very pleasant 88 year old gentleman, here for a follow-up regarding his CAD, paroxysmal atrial fibrillation and dyslipidemia.  ?  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  He still remains active.  Weight has stabilized.      He denies any chest pains or palpitations.  There have been no recent dizzy episodes or falls.      There is history of documented coronary disease.  He has had PCI's and then had a two-vessel bypass in 2012 using a mammary to the LAD and a vein graft to the RCA.  He has done remarkably well since his bypass.  Stress test in 2020 was negative for ischemia.  The LV systolic function has been normal.    His medications include Eliquis and atorvastatin.  He is also on dapagliflozin   ?  He does not smoke.           Vitals:    04/05/22 1024   BP: 119/76   BP Source: Arm, Left Upper   Pulse: 71   PainSc: Zero   Weight: 68.8 kg (151 lb 11.2 oz)   Height: 172.7 cm (5' 8)     Body mass index is 23.07 kg/m?Marland Kitchen     Past Medical History  Patient Active Problem List    Diagnosis Date Noted   ? Vertigo 09/11/2013     08/24/13 CT Head w/o Contrast: [facility]:  1. Mild to moderate chronic periventricular ischemic changes.  2. No acute appearing intracranial process.       ? PVC's (premature ventricular contractions) 05/14/2013   ? Sinus bradycardia 04/09/2013     8/14 treadmill testing Underlying rhythm is sinus brady with isolated PVCs. The pt exercised on a Std Bruce protocol. The HR increased to the 60s & 70s quickly. It was 100-110/min by the end of Stg 1. The peak HR was 137/min (90% of MPHR) at the end of 6 min, No ST depressions were seen, The PVCs were suppressed during exercise.  8/14 HR 45/min, no symptoms. TSH normal in 8/14.     ? Pre-syncope 11/03/2011     2/13 seen at the [facility] hosp ER, ACEI & bb d/ced as the HR was in the 40s & BP was also low ? CVA (cerebral infarction) 07/04/2011     10/12 MRI done for headaches- showed a small CVA per pt.     ? PAF (paroxysmal atrial fibrillation) (HCC) 11/18/2010       09/2020-[facility], possible TIA, noncompliance with Eliquis  7/12 off amio  Developed atrial fibrillation post-CABG phase. Rx with amiodarone.     ? 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 GERD (gastroesophageal reflux disease) 08/06/2009   ? Urethral stricture 08/06/2009   ? ED (erectile dysfunction) 08/06/2009   ? HTN (hypertension) 03/11/2009   ? CAD (coronary artery disease) 02/17/2009     5/13 Rega thallium -ve for ischemia. Done for evaluation of CPs after CABG  1.  01/07 BMS distal [id number], BMS distal LCF 2-25x12, prox LAD 50% Rx medically  2.  08/09 stress thall EF 66% normal  3.  06/10 Echo EF 60%,  mild LVH, chambers normal, mild aortic sclerosis, PAP 27  4.  07/10 chest pain.  Cath at [place]- LM normal , LAD prox 50% mid 60%, distal stent 20%, LCX stants 20%,    RCA chronic 99% stenosis.  FFR negative in LAD EF 55% Rx medically  5.  3/12 CABG 2 V bypass     ? Diabetes mellitus type 2 in obese (HCC) 02/17/2009   ? Dyslipidemia 02/17/2009     5/13 LDL 101  5/12 excellent profile except for TG at 202     ? Pulmonary asbestosis (HCC) 02/17/2009     F/u with pulmonary at [facility]           Review of Systems   Constitutional: Negative.   HENT: Negative.    Eyes: Negative.    Cardiovascular: Positive for chest pain.   Respiratory: Negative.    Endocrine: Negative.    Hematologic/Lymphatic: Negative.    Skin: Negative.    Gastrointestinal: Negative.    Genitourinary: Negative.    Neurological: Positive for light-headedness and loss of balance.   Psychiatric/Behavioral: Negative.    Allergic/Immunologic: Negative.        Physical Exam  General Appearance: well for age, appears comfortable  Skin: warm, no ulcers, no xanthomas  Eyes/lids: no conjunctival pallor, no arcus, no xanthelasma  Lips/oral mucosa: no cyanosis  Neck: neck veins flat  Carotids: normal upstroke, no bruit  Chest: normal appearance  Lungs: clear  Cardiac rhythm: regular rhythm & normal rate  Cardiac auscultation: Normal S1 & S2, no S3 or S4, no murmur  Abdomen: soft, non tender, bowel sounds normal, no pulsations/bruits  Extremities: no LE edema, no varicosities, 2+ distal pulses  Neurologic/psych: oriented, no gross motor deficits, normal gait, normal mood & affect       EKG shows sinus rhythm with frequent PACs  Cardiovascular Health Factors  Vitals BP Readings from Last 3 Encounters:   04/05/22 119/76   10/06/21 137/67   04/30/21 105/66     Wt Readings from Last 3 Encounters:   04/05/22 68.8 kg (151 lb 11.2 oz)   10/06/21 69 kg (152 lb 3.2 oz)   04/30/21 67.8 kg (149 lb 6.4 oz)     BMI Readings from Last 3 Encounters:   04/05/22 23.07 kg/m?   10/06/21 23.14 kg/m?   04/30/21 24.86 kg/m?      Smoking Social History     Tobacco Use   Smoking Status Former   Smokeless Tobacco Former   ? Types: Chew   ? Quit date: 04/10/1963      Lipid Profile Cholesterol   Date Value Ref Range Status   01/08/2019 142  Final     HDL   Date Value Ref Range Status   01/08/2019 60  Final     LDL   Date Value Ref Range Status   01/08/2019 59  Final     Triglycerides   Date Value Ref Range Status   01/08/2019 115  Final      Blood Sugar Hemoglobin A1C   Date Value Ref Range Status   10/07/2010 7.0 (H) 4.0 - 6.0 % Final     Comment:     NOTE NEW REFERENCE RANGES  For patients with Type I or Type II Diabetes Mellitus, the ADA recommends   maintaining the A1c level <7%.     Glucose   Date Value Ref Range Status   07/26/2021 195  Final   07/21/2020 145  Final   01/22/2020 137  Final     Glucose Fasting   Date Value Ref Range Status   03/11/2009 144 (H) 70 - 100 MG/DL Final     Glucose, POC   Date Value Ref Range Status   10/21/2010 178 (H) 70 - 100 MG/DL Final   30/86/5784 696 (H) 70 - 100 MG/DL Final   29/52/8413 244 (H) 70 - 100 MG/DL Final          Problems Addressed Today  Encounter Diagnoses   Name Primary?   ? PAF (paroxysmal atrial fibrillation) (HCC) Yes   ? Coronary artery disease involving native coronary artery of native heart without angina pectoris    ? Dyslipidemia        Assessment and Plan    ?In summary, Andrew Chung is an 88 year old gentleman, looks younger than his stated age, here with the underlying cardiovascular and related issues:   ??  1. Prior history of hypertension,?currently off all antihypertensives with well-controlled pressures probably due to weight loss.    ??  2. Dyslipidemia,?on atorvastatin 20 mg daily.  Cholesterol profile is needed      3. Coronary disease, prior bypass surgery/pcis, remains chest pain-free despite being active.  As mentioned above, stress test in 2020 was negative for ischemia.  The LV systolic function has been normal  4. Paroxysmal atrial fibrillation,?asymptomatic, incidentally found?on a?Holter monitor.   CHA2DS2-VASc score is high on account of prior TIAs/CAD.  Continue on Eliquis 5 mg twice daily     ??  ?  It was a pleasure to see Andrew Chung in the clinic today.  ?           Current Medications (including today's revisions)  ? apixaban (ELIQUIS) 5 mg tablet Take one tablet by mouth twice daily.   ? atorvastatin (LIPITOR) 20 mg tablet Take one tablet by mouth daily.   ? Cholecalciferol (Vitamin D3) 50 mcg (2,000 unit) capsule Take one capsule by mouth daily.   ? Cyanocobalamin 250 mcg tab Take one tablet by mouth daily.   ? dapagliflozin propanediol (FARXIGA) 10 mg tablet Take one tablet by mouth daily.   ? sildenafiL (VIAGRA) 50 mg tablet Take one tablet by mouth as Needed for Erectile dysfunction.   ? tamsulosin (FLOMAX) 0.4 mg capsule Take one capsule by mouth at bedtime daily. Do not crush, chew or open capsules. Take 30 minutes following the same meal each day.   ? zinc sulfate 220 mg (50 mg elemental zinc) capsule Take one capsule by mouth daily.

## 2022-04-06 ENCOUNTER — Encounter: Admit: 2022-04-06 | Discharge: 2022-04-06 | Payer: MEDICARE

## 2022-04-06 DIAGNOSIS — E785 Hyperlipidemia, unspecified: Secondary | ICD-10-CM

## 2022-04-06 DIAGNOSIS — I10 Essential (primary) hypertension: Secondary | ICD-10-CM

## 2022-04-06 DIAGNOSIS — I2510 Atherosclerotic heart disease of native coronary artery without angina pectoris: Secondary | ICD-10-CM

## 2022-04-06 LAB — LIPID PROFILE
CHOLESTEROL/HDL %: 2
CHOLESTEROL: 145
HDL: 67
LDL: 62
TRIGLYCERIDES: 82
VLDL: 16

## 2022-04-06 NOTE — Progress Notes
04/06/2022 3:47 PM    FLP stable. No changes to plan of care.

## 2022-11-14 ENCOUNTER — Encounter: Admit: 2022-11-14 | Discharge: 2022-11-14 | Payer: MEDICARE

## 2022-11-23 ENCOUNTER — Encounter: Admit: 2022-11-23 | Discharge: 2022-11-23 | Payer: MEDICARE

## 2022-11-23 NOTE — Progress Notes
Andrew Chung, 04-26-1931 has an appointment with [facility] on 03/01/2023.    Please send recent lab results for continuity of care.    Thank you,   [name]    Phone: [phone]  Fax: [phone]

## 2022-11-29 ENCOUNTER — Encounter: Admit: 2022-11-29 | Discharge: 2022-11-29 | Payer: MEDICARE

## 2022-11-29 ENCOUNTER — Ambulatory Visit: Admit: 2022-11-29 | Discharge: 2022-11-30 | Payer: MEDICARE

## 2022-11-29 DIAGNOSIS — E1169 Type 2 diabetes mellitus with other specified complication: Secondary | ICD-10-CM

## 2022-11-29 DIAGNOSIS — D649 Anemia, unspecified: Secondary | ICD-10-CM

## 2022-11-29 DIAGNOSIS — K219 Gastro-esophageal reflux disease without esophagitis: Secondary | ICD-10-CM

## 2022-11-29 DIAGNOSIS — F32A Depression: Secondary | ICD-10-CM

## 2022-11-29 DIAGNOSIS — G43909 Migraine, unspecified, not intractable, without status migrainosus: Secondary | ICD-10-CM

## 2022-11-29 DIAGNOSIS — Z9229 Personal history of other drug therapy: Secondary | ICD-10-CM

## 2022-11-29 DIAGNOSIS — I10 Essential (primary) hypertension: Secondary | ICD-10-CM

## 2022-11-29 DIAGNOSIS — E785 Hyperlipidemia, unspecified: Secondary | ICD-10-CM

## 2022-11-29 DIAGNOSIS — I2510 Atherosclerotic heart disease of native coronary artery without angina pectoris: Secondary | ICD-10-CM

## 2022-11-29 DIAGNOSIS — T50905A Adverse effect of unspecified drugs, medicaments and biological substances, initial encounter: Secondary | ICD-10-CM

## 2022-11-29 DIAGNOSIS — I480 Paroxysmal atrial fibrillation: Secondary | ICD-10-CM

## 2022-11-29 DIAGNOSIS — J61 Pneumoconiosis due to asbestos and other mineral fibers: Secondary | ICD-10-CM

## 2022-11-29 DIAGNOSIS — N529 Male erectile dysfunction, unspecified: Secondary | ICD-10-CM

## 2022-11-29 DIAGNOSIS — I4891 Unspecified atrial fibrillation: Secondary | ICD-10-CM

## 2022-11-29 DIAGNOSIS — E119 Type 2 diabetes mellitus without complications: Secondary | ICD-10-CM

## 2022-11-29 NOTE — Patient Instructions
Follow-Up:    -Thank you for allowing [place] to participate in your care today. Your After Visit Summary is being completed by Marland Kitchen, RN.    -We would like you to follow up in  1 years with Burke Keels, MD  -The schedule is released approximately 4-5 months in advance. You will be called by our scheduling department to make an appointment and you will also receive a notification via MyChart to self-schedule.  However, if you would like to call to make this appointment, please call [phone].    -Please schedule the following testing at check out, or by calling our scheduling line: ECHOCARDIOGRAM      Contacting our office:    -For NON-URGENT questions please contact us via message through your MyChart account.   -For all medication refills please contact your pharmacy or send a request through MyChart.     -For all questions that may need to be addressed urgently please call the [facility] nursing triage line at [phone] Monday - Friday 8am-5pm only. Please leave a detailed message with your name, date of birth, and reason for your call.  If your message is received before 3:30pm, every effort will be made to call you back the same day.  Please allow time for [place] to review your chart prior to call back.     -Should you have an urgent concern over the weekend/nights, the on-call triage line is [phone].    [facility] Hail nursing team fax number: [phone]    -You may receive a survey in the upcoming weeks from [facility]. Your feedback is important to [place] and helps [place] continue to improve patient care and patient satisfaction.     -Please feel free to call our Financial Department at [phone] with any questions or concerns about estimated cost of testing or imaging ordered today. We are happy to provide CPT codes upon request.    Results & Testing Follow Up:    -Please allow 5-7 business days for the results of any testing to be reviewed. Please call our office if you have not heard from a nurse within this time frame.    -Should you choose to complete testing at an outside facility, please contact our office after completion of testing so that we can ensure that we have received results for your provider to review.    Lab and test results:  As a part of the CARES act, starting 12/05/2019, some results will be released to you via MyChart immediately and automatically.  You may see results before your provider sees them; however, your provider will review all these results and then they, or one of their team, will notify you of result information and recommendations.   Critical results will be addressed immediately, but otherwise, please allow [place] time to get back with you prior to you reaching out to [place] for questions.  This will usually take about 72 hours for labs and 5-7 days for procedure test results.

## 2022-11-29 NOTE — Progress Notes
Date of Service: 11/29/2022    Andrew KOSIOR Sr. is a 89 y.o. male.       HPI     Andrew Chung is a very pleasant 89 year old diabetic gentleman, coming in for continued care of his CAD, paroxysmal atrial fibrillation and dyslipidemia.     He has lost 3 more pounds since I saw him in August 2023.  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  His son helps him a little bit.  He still remains active.      Coronary bypass was back in 2012.  Mammary artery was attached  to the LAD and a vein graft to the RCA.  He denies any chest pains or palpitations.  Stress test in 2020 was negative for ischemia.      There is prior history of paroxysmal atrial fibrillation and a CVA.  He does not feel any palpitations or undue fatigue.    He is complaining of cost of Eliquis and his SGLT2 inhibitor.       He does not smoke.           Vitals:    11/29/22 1351   BP: 131/71   BP Source: Arm, Left Upper   Pulse: 67   SpO2: 98%   PainSc: Zero   Weight: 67.5 kg (148 lb 14.4 oz)   Height: 172.7 cm (5' 8)     Body mass index is 22.64 kg/m?Marland Kitchen     Past Medical History  Patient Active Problem List    Diagnosis Date Noted    Vertigo 09/11/2013     08/24/13 CT Head w/o Contrast: [facility]:  1. Mild to moderate chronic periventricular ischemic changes.  2. No acute appearing intracranial process.        PVC's (premature ventricular contractions) 05/14/2013    Sinus bradycardia 04/09/2013     8/14 treadmill testing Underlying rhythm is sinus brady with isolated PVCs. The pt exercised on a Std Bruce protocol. The HR increased to the 60s & 70s quickly. It was 100-110/min by the end of Stg 1. The peak HR was 137/min (90% of MPHR) at the end of 6 min, No ST depressions were seen, The PVCs were suppressed during exercise.  8/14 HR 45/min, no symptoms. TSH normal in 8/14.      Pre-syncope 11/03/2011     2/13 seen at the [facility] hosp ER, ACEI & bb d/ced as the HR was in the 40s & BP was also low      CVA (cerebral infarction) 07/04/2011     10/12 MRI done for headaches- showed a small CVA per pt.      PAF (paroxysmal atrial fibrillation) (HCC) 11/18/2010       09/2020-[facility], possible TIA, noncompliance with Eliquis  7/12 off amio  Developed atrial fibrillation post-CABG phase. Rx with amiodarone.      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 Done for evaluation of CPs after CABG  1.  01/07 BMS distal [id number], BMS distal LCF 2-25x12, prox LAD 50% Rx medically  2.  08/09 stress thall EF 66% normal  3.  06/10  Echo EF 60%, mild LVH, chambers normal, mild aortic sclerosis, PAP 27  4.  07/10 chest pain.  Cath at [facility]- LM normal , LAD prox 50% mid 60%, distal stent 20%, LCX stants 20%,    RCA chronic 99% stenosis.  FFR negative in LAD EF 55% Rx medically  5.  3/12 CABG 2 V bypass      Diabetes mellitus type 2 in obese  (HCC) 02/17/2009    Dyslipidemia 02/17/2009     5/13 LDL 101  5/12 excellent profile except for TG at 202      Pulmonary asbestosis (HCC) 02/17/2009     F/u with pulmonary at [facility]           Review of Systems   Constitutional: Negative.   HENT: Negative.     Eyes: Negative.    Cardiovascular: Negative.    Respiratory: Negative.     Endocrine: Negative.    Hematologic/Lymphatic: Negative.    Skin: Negative.    Gastrointestinal: Negative.    Genitourinary: Negative.    Neurological:  Positive for light-headedness and loss of balance.   Psychiatric/Behavioral: Negative.     Allergic/Immunologic: Negative.        Physical Exam  General Appearance: well for age, appears comfortable  Skin: warm, no ulcers, no xanthomas  Eyes/lids: no conjunctival pallor, no arcus, no xanthelasma  Lips/oral mucosa: no cyanosis  Neck: neck veins flat  Carotids: normal upstroke, no bruit  Chest: normal appearance  Lungs: clear  Cardiac rhythm: regular rhythm & normal rate  Cardiac auscultation: Normal S1 & S2, no S3 or S4, soft ESM base  Abdomen: soft, non tender, bowel sounds normal, no pulsations/bruits  Extremities: no LE edema, no varicosities, 2+ distal pulses  Neurologic/psych: oriented, no gross motor deficits, normal gait, normal mood & affect       EKG shows sinus rhythm with frequent PACs  Cardiovascular Health Factors  Vitals BP Readings from Last 3 Encounters:   11/29/22 131/71   04/05/22 119/76   10/06/21 137/67     Wt Readings from Last 3 Encounters:   11/29/22 67.5 kg (148 lb 14.4 oz)   04/05/22 68.8 kg (151 lb 11.2 oz)   10/06/21 69 kg (152 lb 3.2 oz)     BMI Readings from Last 3 Encounters:   11/29/22 22.64 kg/m?   04/05/22 23.07 kg/m?   10/06/21 23.14 kg/m?      Smoking Social History     Tobacco Use   Smoking Status Former   Smokeless Tobacco Former    Types: Chew    Quit date: 04/10/1963      Lipid Profile Cholesterol   Date Value Ref Range Status   04/06/2022 145  Final     HDL   Date Value Ref Range Status   04/06/2022 67  Final     LDL   Date Value Ref Range Status   04/06/2022 62  Final     Triglycerides   Date Value Ref Range Status   04/06/2022 82  Final      Blood Sugar Hemoglobin A1C   Date Value Ref Range Status   10/07/2010 7.0 (H) 4.0 - 6.0 % Final     Comment:     NOTE NEW REFERENCE RANGES  For patients with Type I or Type II Diabetes Mellitus, the ADA recommends   maintaining the A1c level <7%.     Glucose   Date Value Ref Range Status   07/26/2021 195  Final   07/21/2020 145  Final   01/22/2020 137  Final     Glucose Fasting   Date Value Ref Range Status   03/11/2009 144 (H) 70 - 100 MG/DL Final     Glucose, POC   Date Value Ref Range Status   10/21/2010 178 (H) 70 - 100 MG/DL Final   16/06/9603 540 (H) 70 - 100 MG/DL Final   98/07/9146 829 (H) 70 - 100 MG/DL Final          Problems Addressed Today  Encounter Diagnoses   Name Primary?    PAF (paroxysmal atrial fibrillation) (HCC) Yes    Coronary artery disease involving native coronary artery of native heart without angina pectoris        Assessment and Plan     In summary, [name] is an 89 year old gentleman, looks younger than his stated age, here with the underlying cardiovascular and related issues:       Essential hypertension, currently resolved due to weight loss         Dyslipidemia, on atorvastatin 20 mg daily.  Cholesterol profile in August 2023 was excellent     Coronary disease, two-vessel bypass in 2012, remains chest pain-free despite being very active.    Paroxysmal atrial fibrillation, asymptomatic, incidentally found on a Holter monitor.   CHA2DS2-VASc score is high on account of prior TIAs/CAD.  Continue on Eliquis 5 mg twice daily.  Echo Doppler to be obtained            It was a pleasure to see Andrew Chung in the clinic today.              Current Medications (including today's revisions)   apixaban (ELIQUIS) 5 mg tablet Take one tablet by mouth twice daily.    atorvastatin (LIPITOR) 20 mg tablet Take one tablet by mouth daily.    Cholecalciferol (Vitamin D3) 50 mcg (2,000 unit) capsule Take one capsule by mouth daily. When  he remembers    Cyanocobalamin 250 mcg tab Take one tablet by mouth daily.    dapagliflozin propanediol (FARXIGA) 10 mg tablet Take one tablet by mouth daily.    sildenafiL (VIAGRA) 50 mg tablet Take one tablet by mouth as Needed for Erectile dysfunction.    tamsulosin (FLOMAX) 0.4 mg capsule Take one capsule by mouth at bedtime daily. Do not crush, chew or open capsules. Take 30 minutes following the same meal each day.    zinc sulfate 220 mg (50 mg elemental zinc) capsule Take one capsule by mouth daily. Takes somedays

## 2022-11-30 DIAGNOSIS — I2510 Atherosclerotic heart disease of native coronary artery without angina pectoris: Secondary | ICD-10-CM

## 2023-01-03 ENCOUNTER — Ambulatory Visit: Admit: 2023-01-03 | Discharge: 2023-01-03 | Payer: MEDICARE

## 2023-01-03 ENCOUNTER — Encounter: Admit: 2023-01-03 | Discharge: 2023-01-03 | Payer: MEDICARE

## 2023-01-03 DIAGNOSIS — I480 Paroxysmal atrial fibrillation: Secondary | ICD-10-CM

## 2023-01-03 DIAGNOSIS — I2510 Atherosclerotic heart disease of native coronary artery without angina pectoris: Secondary | ICD-10-CM

## 2023-01-03 MED ORDER — SODIUM CHLORIDE 0.9 % IJ SOLN
10 mL | Freq: Once | INTRAVENOUS | 0 refills | Status: CP
Start: 2023-01-03 — End: ?
  Administered 2023-01-03: 20:00:00 10 mL via INTRAVENOUS

## 2023-01-03 MED ORDER — PERFLUTREN LIPID MICROSPHERES 1.1 MG/ML IV SUSP
1-10 mL | Freq: Once | INTRAVENOUS | 0 refills | Status: CP | PRN
Start: 2023-01-03 — End: ?
  Administered 2023-01-03: 20:00:00 3 mL via INTRAVENOUS

## 2023-01-12 ENCOUNTER — Encounter: Admit: 2023-01-12 | Discharge: 2023-01-12 | Payer: MEDICARE

## 2023-01-17 ENCOUNTER — Encounter: Admit: 2023-01-17 | Discharge: 2023-01-17 | Payer: MEDICARE

## 2023-02-09 ENCOUNTER — Encounter: Admit: 2023-02-09 | Discharge: 2023-02-09 | Payer: MEDICARE

## 2023-02-09 NOTE — Telephone Encounter
02/09/2023 10:47 AM    Received a phone call that pt will need cardiac clearance. Pt. Has a planned Circumcision at [facility] in [place]. Phone [phone]. Fax: [phone].     Last OV 11/29/22   Pt is on Xarelto for A. Fib  Last Echo and Stress test: 2020

## 2023-02-10 ENCOUNTER — Encounter: Admit: 2023-02-10 | Discharge: 2023-02-10 | Payer: MEDICARE

## 2023-04-28 ENCOUNTER — Encounter: Admit: 2023-04-28 | Discharge: 2023-04-28 | Payer: MEDICARE

## 2023-04-28 NOTE — Telephone Encounter
04/28/2023 3:04 PM records received, lab entered  VS: T 97.5, HR 81, Resp 16, BP 120/65, Pulse Ox 97, Wt 63.5kg.   CXR w/ no acute findings  Pt had concerns about BMs, advised to start taking stool softeners and Miralax daily  Was given Rx for NTG  Records to scanning

## 2023-04-28 NOTE — Telephone Encounter
Pt called and left a message asking when his next appt is. I called him back and left a message that he is due in March 2025. I left the scheduling number for him to call to schedule.

## 2023-04-28 NOTE — Telephone Encounter
04/28/2023 10:29 AM    Pt calling to get in w/ Dr Iver Nestle  He's been having chest pressure and tightness, sadi he went to [facility] 2 or 3 days ago and they sent him home, told him he was fine. He's still been having same chest pain/pressure/tightness. 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     I told him, multiple times, he needs to go to ER TODAY, [facility] or [facility] [place] are closest.   He told me he's going to go check on his son, Deniece Portela who's working on the tractor and go today.   He said he has nitro but has never used it, reviewed to be sitting or lying down when using.   He can't give me(his son) Wayne's phone number right now.   Confirmed he wanted me to take [facility] Quin off of his emerg contact list. (I haven't done this yet since we don't have another emerg contact PH #)      I called [facility], [phone], sp w/ Misty in [facility], she will fax records, including EKG.

## 2023-05-15 ENCOUNTER — Encounter: Admit: 2023-05-15 | Discharge: 2023-05-15 | Payer: MEDICARE

## 2023-09-13 ENCOUNTER — Encounter: Admit: 2023-09-13 | Discharge: 2023-09-13 | Payer: MEDICARE

## 2023-09-13 NOTE — Progress Notes
Andrew Abbe Sr., [birth year] has an appointment with Dr. Iver Nestle on 09/18/22.    Please send recent lab results for continuity of care.    Thank you,   Sani Madariaga    Phone: [phone]  Fax: [phone]

## 2023-09-19 ENCOUNTER — Encounter: Admit: 2023-09-19 | Discharge: 2023-09-19 | Payer: MEDICARE

## 2023-12-27 ENCOUNTER — Encounter: Admit: 2023-12-27 | Discharge: 2023-12-27 | Payer: MEDICARE

## 2024-04-19 ENCOUNTER — Encounter: Admit: 2024-04-19 | Discharge: 2024-04-19 | Payer: MEDICARE

## 2024-04-19 ENCOUNTER — Ambulatory Visit: Admit: 2024-04-19 | Discharge: 2024-04-20 | Payer: MEDICARE

## 2024-05-10 ENCOUNTER — Encounter: Admit: 2024-05-10 | Discharge: 2024-05-10 | Payer: MEDICARE

## 2024-05-10 NOTE — Telephone Encounter
 CT orders successfully faxed to [facility] fax # [phone].

## 2024-05-14 ENCOUNTER — Encounter: Admit: 2024-05-14 | Discharge: 2024-05-14 | Payer: MEDICARE

## 2024-05-17 ENCOUNTER — Encounter: Admit: 2024-05-17 | Discharge: 2024-05-17 | Payer: MEDICARE

## 2024-05-20 ENCOUNTER — Encounter: Admit: 2024-05-20 | Discharge: 2024-05-20 | Payer: MEDICARE

## 2024-06-03 ENCOUNTER — Encounter: Admit: 2024-06-03 | Discharge: 2024-06-03 | Payer: MEDICARE

## 2024-06-06 ENCOUNTER — Encounter: Admit: 2024-06-06 | Discharge: 2024-06-06 | Payer: MEDICARE

## 2024-06-07 ENCOUNTER — Encounter: Admit: 2024-06-07 | Discharge: 2024-06-07 | Payer: MEDICARE

## 2024-06-17 ENCOUNTER — Ambulatory Visit: Admit: 2024-06-17 | Discharge: 2024-06-18 | Payer: MEDICARE

## 2024-06-17 ENCOUNTER — Encounter: Admit: 2024-06-17 | Discharge: 2024-06-17 | Payer: MEDICARE

## 2024-06-17 VITALS — BP 148/65 | HR 53 | Temp 97.00000°F | Ht 69.0 in | Wt 144.0 lb

## 2024-06-17 DIAGNOSIS — R1030 Lower abdominal pain, unspecified: Secondary | ICD-10-CM

## 2024-06-17 DIAGNOSIS — R634 Abnormal weight loss: Principal | ICD-10-CM

## 2024-06-17 DIAGNOSIS — R1319 Other dysphagia: Secondary | ICD-10-CM

## 2024-06-17 NOTE — Progress Notes
 Date of Service: 06/17/2024    Andrew KOLENOVIC Sr. is a 89 y.o. male.  DOB: 03/07/1931  MRN: [id number]     Subjective:  Andrew Chung is a pleasant 89 year old male patient referred to GI clinic by [facility] for constipation and abdominal pain.  He has history of pulmonary asbestosis, hypertension, CVA, vertigo, GERD, type 2 diabetes.      Previous workup  EGD and colonoscopy in 2012 were normal.  CT abd/pelvis w/ 05/29/2024: extensive calcified pleural formation which is unchanged from prior exam (2020). There is moderate retained fecal material, otherwise normal.     He presents today for follow up. He is seen in the presence of his daughter. His weight remains stable. He continues to drink protein shakes daily. He reports occasional dysphagia when he eats too quickly. He has had EGD with dilation in the past per his recall. He wants to think about if he wants to proceed with this again. He reports constipation has improved and abdominal pain is improved with Levsin.       Objective:          atorvastatin  (LIPITOR) 20 mg tablet Take one tablet by mouth daily.    Cholecalciferol (Vitamin D3) 50 mcg (2,000 unit) capsule Take one capsule by mouth daily. When  he remembers    Cyanocobalamin 250 mcg tab Take one tablet by mouth daily.    dapagliflozin propanediol (FARXIGA) 10 mg tablet Take one tablet by mouth daily.    empagliflozin (JARDIANCE) 25 mg tablet Take one tablet by mouth daily.    hyoscyamine sulfate (LEVSIN/SL) 0.125 mg sublingual tablet Place one tablet under tongue four times daily as needed for Cramps.    rivaroxaban  (XARELTO ) 20 mg tablet Take one tablet by mouth daily.    sildenafiL  (VIAGRA ) 50 mg tablet Take one tablet by mouth as Needed for Erectile dysfunction.    tamsulosin (FLOMAX) 0.4 mg capsule Take one capsule by mouth at bedtime daily. Do not crush, chew or open capsules. Take 30 minutes following the same meal each day.    zinc sulfate 220 mg (50 mg elemental zinc) capsule Take one capsule by mouth daily. Takes somedays     Vitals:    06/17/24 0814   BP: (!) 148/65   Pulse: 53   Temp: 36.1 ?C (97 ?F)   TempSrc: Skin   PainSc: Zero   Weight: 65.3 kg (144 lb)   Height: 175.3 cm (5' 9)     Body mass index is 21.27 kg/m?SABRA     Physical Exam  Constitutional:       Appearance: Normal appearance.   HENT:      Head: Normocephalic.   Pulmonary:      Effort: Pulmonary effort is normal.   Abdominal:      Palpations: Abdomen is soft.   Neurological:      Mental Status: He is alert.   Psychiatric:         Mood and Affect: Mood normal.         Behavior: Behavior normal.         Assessment and Plan:  1. Unintentional weight loss (Primary)  - Continue protein shakes  - Monitor weight    2. Esophageal dysphagia  - Chew your food well  - Swallow one bite at a time  - Sit upright or walk around when eating  - Do not lay down for at least 2-3 hours after eating   - Drink plenty of liquids (  water and/or juice) during meals to help swallow dry foods  - Wait for one bite (bolus) to go down to stomach before eating another bite  - Consider EGD if dysphagia symptoms do not improve    3. Lower abdominal pain  - Continue Levsin      RTC PRN
